# Patient Record
Sex: Female | Born: 1985 | Race: White | Hispanic: No | Marital: Single | Smoking: Current every day smoker
Health system: Southern US, Community
[De-identification: ages and names within clinical notes are randomized; demographics above are authoritative.]

## PROBLEM LIST (undated history)

## (undated) DIAGNOSIS — B009 Herpesviral infection, unspecified: Secondary | ICD-10-CM

## (undated) DIAGNOSIS — E079 Disorder of thyroid, unspecified: Secondary | ICD-10-CM

## (undated) DIAGNOSIS — F1910 Other psychoactive substance abuse, uncomplicated: Secondary | ICD-10-CM

## (undated) HISTORY — PX: LASIK: SHX215

## (undated) HISTORY — PX: REFRACTIVE SURGERY: SHX103

---

## 2013-09-10 ENCOUNTER — Encounter (HOSPITAL_BASED_OUTPATIENT_CLINIC_OR_DEPARTMENT_OTHER): Payer: Self-pay | Admitting: Emergency Medicine

## 2013-09-10 ENCOUNTER — Emergency Department (HOSPITAL_BASED_OUTPATIENT_CLINIC_OR_DEPARTMENT_OTHER)
Admission: EM | Admit: 2013-09-10 | Discharge: 2013-09-10 | Disposition: A | Discharge status: Home / Self Care | Payer: Medicaid Other | Attending: Emergency Medicine | Admitting: Emergency Medicine

## 2013-09-10 ENCOUNTER — Emergency Department (HOSPITAL_BASED_OUTPATIENT_CLINIC_OR_DEPARTMENT_OTHER): Payer: Medicaid Other

## 2013-09-10 DIAGNOSIS — R109 Unspecified abdominal pain: Secondary | ICD-10-CM

## 2013-09-10 DIAGNOSIS — Z862 Personal history of diseases of the blood and blood-forming organs and certain disorders involving the immune mechanism: Secondary | ICD-10-CM | POA: Insufficient documentation

## 2013-09-10 DIAGNOSIS — Z8639 Personal history of other endocrine, nutritional and metabolic disease: Secondary | ICD-10-CM | POA: Insufficient documentation

## 2013-09-10 DIAGNOSIS — Z3202 Encounter for pregnancy test, result negative: Secondary | ICD-10-CM | POA: Insufficient documentation

## 2013-09-10 DIAGNOSIS — R1011 Right upper quadrant pain: Secondary | ICD-10-CM | POA: Insufficient documentation

## 2013-09-10 HISTORY — DX: Disorder of thyroid, unspecified: E07.9

## 2013-09-10 LAB — COMPREHENSIVE METABOLIC PANEL
ALT: 23 U/L (ref 0–35)
AST: 22 U/L (ref 0–37)
Alkaline Phosphatase: 56 U/L (ref 39–117)
CO2: 31 mEq/L (ref 19–32)
Calcium: 10 mg/dL (ref 8.4–10.5)
Chloride: 99 mEq/L (ref 96–112)
GFR calc Af Amer: >90 mL/min (ref >90)
GFR calc non Af Amer: >90 mL/min (ref >90)
Glucose, Bld: 112 mg/dL — ABNORMAL HIGH (ref 70–99)
Potassium: 4.1 mEq/L (ref 3.5–5.1)
Sodium: 139 mEq/L (ref 135–145)
Total Bilirubin: 0.2 mg/dL — ABNORMAL LOW (ref 0.3–1.2)

## 2013-09-10 LAB — URINALYSIS, ROUTINE W REFLEX MICROSCOPIC
Bilirubin Urine: NEGATIVE
Glucose, UA: NEGATIVE mg/dL
Hgb urine dipstick: NEGATIVE
Protein, ur: NEGATIVE mg/dL
Urobilinogen, UA: 0.2 mg/dL (ref 0.0–1.0)
pH: 6.5 (ref 5.0–8.0)

## 2013-09-10 LAB — CBC WITH DIFFERENTIAL/PLATELET
Band Neutrophils: 4 % (ref 0–10)
Basophils Absolute: 0 10*3/uL (ref 0.0–0.1)
Blasts: 0 %
HCT: 35.7 % — ABNORMAL LOW (ref 36.0–46.0)
MCHC: 33.1 g/dL (ref 30.0–36.0)
MCV: 93.2 fL (ref 78.0–100.0)
Metamyelocytes Relative: 0 %
Monocytes Absolute: 0.4 10*3/uL (ref 0.1–1.0)
Monocytes Relative: 3 % (ref 3–12)
Myelocytes: 0 %
Neutro Abs: 9.5 10*3/uL — ABNORMAL HIGH (ref 1.7–7.7)
Platelets: 273 10*3/uL (ref 150–400)
Promyelocytes Absolute: 0 %
RDW: 12.8 % (ref 11.5–15.5)
WBC: 12.5 10*3/uL — ABNORMAL HIGH (ref 4.0–10.5)
nRBC: 0 /100 WBC

## 2013-09-10 LAB — PREGNANCY, URINE: Preg Test, Ur: NEGATIVE

## 2013-09-10 NOTE — ED Provider Notes (Signed)
Medical screening examination/treatment/procedure(s) were performed by non-physician practitioner and as supervising physician I was immediately available for consultation/collaboration.  EKG Interpretation   None         Gwyneth Sprout, MD 09/10/13 1528

## 2013-09-10 NOTE — ED Provider Notes (Signed)
CSN: [id number]     Arrival date & time 09/10/13  1153 History   First MD Initiated Contact with Patient 09/10/13 1203     Chief Complaint  Patient presents with  . Abdominal Pain   (Consider location/radiation/quality/duration/timing/severity/associated sxs/prior Treatment) HPI Comments: Pt states that she has been having right upper abdominal pain for 1 week:pt states that she has been in detox for a week so she isn't sure if it is related to that:pt states that her partner whom she shared needles with is hep c:pt states that she was tested 3 weeks ago and everything was negative, but she read that it may not show up for six months:denies fever, nausea, dysuria, vomiting and diarrhea:pt states that she has had a bad cough  The history is provided by the patient. No language interpreter was used.    Past Medical History  Diagnosis Date  . Thyroid disease    History reviewed. No pertinent past surgical history. No family history on file. History  Substance Use Topics  . Smoking status: Never Smoker   . Smokeless tobacco: Not on file  . Alcohol Use: No   OB History   Grav Para Term Preterm Abortions TAB SAB Ect Mult Living                 Review of Systems  Constitutional: Negative.   Respiratory: Negative.   Cardiovascular: Negative.     Allergies  Review of patient's allergies indicates no known allergies.  Home Medications  No current outpatient prescriptions on file. BP 105/68  Pulse 112  Temp(Src) 98 F (36.7 C) (Oral)  Resp 16  SpO2 98% Physical Exam  Nursing note and vitals reviewed. Constitutional: She is oriented to person, place, and time. She appears well-developed and well-nourished.  Cardiovascular: Normal rate and regular rhythm.   Pulmonary/Chest: Effort normal and breath sounds normal.  Abdominal: Soft. Bowel sounds are normal. There is no tenderness.  Musculoskeletal: Normal range of motion.  Neurological: She is alert and oriented to person,  place, and time.  Skin: Skin is warm and dry.  Psychiatric: She has a normal mood and affect.    ED Course  Procedures (including critical care time) Labs Review Labs Reviewed  CBC WITH DIFFERENTIAL - Abnormal; Notable for the following:    WBC 12.5 (*)    RBC 3.83 (*)    Hemoglobin 11.8 (*)    HCT 35.7 (*)    Neutro Abs 9.5 (*)    All other components within normal limits  COMPREHENSIVE METABOLIC PANEL - Abnormal; Notable for the following:    Glucose, Bld 112 (*)    Total Bilirubin 0.2 (*)    All other components within normal limits  URINALYSIS, ROUTINE W REFLEX MICROSCOPIC - Abnormal; Notable for the following:    APPearance CLOUDY (*)    All other components within normal limits  LIPASE, BLOOD  PREGNANCY, URINE   Imaging Review Dg Chest 2 View  09/10/2013   CLINICAL DATA:  Cough, congestion  EXAM: CHEST  2 VIEW  COMPARISON:  None.  FINDINGS: The heart size and mediastinal contours are within normal limits. There is no focal infiltrate, pulmonary edema, or pleural effusion. The visualized skeletal structures are unremarkable.  IMPRESSION: No active cardiopulmonary disease.   Electronically Signed   By: Sherian Rein M.D.   On: 09/10/2013 13:25    EKG Interpretation   None       MDM   1. Abdominal pain    Abdomen is  benign:no fevers:pt tender on ribs:no narcotics as pt is seeking treated for drugs    Teressa Lower, NP 09/10/13 1458

## 2013-09-10 NOTE — ED Notes (Signed)
Pt reports upper right sided pain x 1 week. 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 States boyfriend positive for hepatitis C, has shared needles in past. Tested x 1 month ago and was negative. Pt reports productive cough, no nausea/vomiting.

## 2013-09-10 NOTE — ED Notes (Signed)
Patient unable to provide urine specimen at this time. Aware of need and given call bell to notify staff when ready

## 2017-05-13 ENCOUNTER — Encounter (HOSPITAL_COMMUNITY): Payer: Self-pay

## 2017-05-13 ENCOUNTER — Emergency Department (HOSPITAL_COMMUNITY)
Admission: EM | Admit: 2017-05-13 | Discharge: 2017-05-13 | Disposition: A | Discharge status: Home / Self Care | Payer: Medicaid Other | Attending: Emergency Medicine | Admitting: Emergency Medicine

## 2017-05-13 ENCOUNTER — Emergency Department (HOSPITAL_COMMUNITY): Payer: Medicaid Other

## 2017-05-13 DIAGNOSIS — Y999 Unspecified external cause status: Secondary | ICD-10-CM | POA: Diagnosis not present

## 2017-05-13 DIAGNOSIS — S0990XA Unspecified injury of head, initial encounter: Secondary | ICD-10-CM | POA: Diagnosis present

## 2017-05-13 DIAGNOSIS — S060X1A Concussion with loss of consciousness of 30 minutes or less, initial encounter: Secondary | ICD-10-CM | POA: Insufficient documentation

## 2017-05-13 DIAGNOSIS — Y929 Unspecified place or not applicable: Secondary | ICD-10-CM | POA: Insufficient documentation

## 2017-05-13 DIAGNOSIS — S161XXA Strain of muscle, fascia and tendon at neck level, initial encounter: Secondary | ICD-10-CM | POA: Diagnosis not present

## 2017-05-13 DIAGNOSIS — Y9389 Activity, other specified: Secondary | ICD-10-CM | POA: Diagnosis not present

## 2017-05-13 DIAGNOSIS — M25512 Pain in left shoulder: Secondary | ICD-10-CM | POA: Diagnosis not present

## 2017-05-13 DIAGNOSIS — M25562 Pain in left knee: Secondary | ICD-10-CM | POA: Diagnosis not present

## 2017-05-13 DIAGNOSIS — S060X9A Concussion with loss of consciousness of unspecified duration, initial encounter: Secondary | ICD-10-CM

## 2017-05-13 LAB — I-STAT BETA HCG BLOOD, ED (MC, WL, AP ONLY)

## 2017-05-13 MED ORDER — ONDANSETRON 4 MG PO TBDP
4.0000 mg | ORAL_TABLET | Freq: Once | ORAL | Status: AC
  Administered 2017-05-13: 4 mg via ORAL
  Filled 2017-05-13: qty 1

## 2017-05-13 MED ORDER — IBUPROFEN 800 MG PO TABS: 800.0000 mg | ORAL_TABLET | Freq: Three times a day (TID) | ORAL | 0 refills | Status: DC | PRN

## 2017-05-13 MED ORDER — KETOROLAC TROMETHAMINE 30 MG/ML IJ SOLN
30.0000 mg | Freq: Once | INTRAMUSCULAR | Status: AC
  Administered 2017-05-13: 30 mg via INTRAMUSCULAR
  Filled 2017-05-13: qty 1

## 2017-05-13 MED ORDER — METHOCARBAMOL 500 MG PO TABS: 500.0000 mg | ORAL_TABLET | Freq: Two times a day (BID) | ORAL | 0 refills | Status: AC

## 2017-05-13 MED ORDER — METHOCARBAMOL 500 MG PO TABS
500.0000 mg | ORAL_TABLET | Freq: Once | ORAL | Status: DC
  Filled 2017-05-13: qty 1

## 2017-05-13 NOTE — ED Notes (Signed)
Patient transported to CT 
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
## 2017-05-13 NOTE — Discharge Instructions (Signed)

## 2017-05-13 NOTE — ED Notes (Signed)
Pt given water for PO challenge 
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
## 2018-12-16 ENCOUNTER — Emergency Department (HOSPITAL_BASED_OUTPATIENT_CLINIC_OR_DEPARTMENT_OTHER): Payer: Self-pay

## 2018-12-16 ENCOUNTER — Encounter (HOSPITAL_BASED_OUTPATIENT_CLINIC_OR_DEPARTMENT_OTHER): Payer: Self-pay | Admitting: Emergency Medicine

## 2018-12-16 ENCOUNTER — Inpatient Hospital Stay (HOSPITAL_BASED_OUTPATIENT_CLINIC_OR_DEPARTMENT_OTHER)
Admission: EM | Admit: 2018-12-16 | Discharge: 2018-12-21 | DRG: 872 | Disposition: A | Discharge status: Home / Self Care | Payer: Self-pay | Attending: Internal Medicine | Admitting: Internal Medicine

## 2018-12-16 ENCOUNTER — Other Ambulatory Visit: Payer: Self-pay

## 2018-12-16 DIAGNOSIS — Z79899 Other long term (current) drug therapy: Secondary | ICD-10-CM

## 2018-12-16 DIAGNOSIS — E876 Hypokalemia: Secondary | ICD-10-CM | POA: Diagnosis present

## 2018-12-16 DIAGNOSIS — N3090 Cystitis, unspecified without hematuria: Secondary | ICD-10-CM | POA: Diagnosis present

## 2018-12-16 DIAGNOSIS — F1210 Cannabis abuse, uncomplicated: Secondary | ICD-10-CM | POA: Diagnosis present

## 2018-12-16 DIAGNOSIS — Z791 Long term (current) use of non-steroidal anti-inflammatories (NSAID): Secondary | ICD-10-CM

## 2018-12-16 DIAGNOSIS — Z716 Tobacco abuse counseling: Secondary | ICD-10-CM

## 2018-12-16 DIAGNOSIS — J219 Acute bronchiolitis, unspecified: Secondary | ICD-10-CM | POA: Diagnosis present

## 2018-12-16 DIAGNOSIS — N12 Tubulo-interstitial nephritis, not specified as acute or chronic: Secondary | ICD-10-CM

## 2018-12-16 DIAGNOSIS — N200 Calculus of kidney: Secondary | ICD-10-CM | POA: Diagnosis present

## 2018-12-16 DIAGNOSIS — D733 Abscess of spleen: Secondary | ICD-10-CM | POA: Diagnosis present

## 2018-12-16 DIAGNOSIS — E041 Nontoxic single thyroid nodule: Secondary | ICD-10-CM | POA: Diagnosis present

## 2018-12-16 DIAGNOSIS — A4151 Sepsis due to Escherichia coli [E. coli]: Principal | ICD-10-CM | POA: Diagnosis present

## 2018-12-16 DIAGNOSIS — F1510 Other stimulant abuse, uncomplicated: Secondary | ICD-10-CM | POA: Diagnosis present

## 2018-12-16 DIAGNOSIS — F1990 Other psychoactive substance use, unspecified, uncomplicated: Secondary | ICD-10-CM | POA: Diagnosis present

## 2018-12-16 DIAGNOSIS — Z1611 Resistance to penicillins: Secondary | ICD-10-CM | POA: Diagnosis present

## 2018-12-16 DIAGNOSIS — N10 Acute tubulo-interstitial nephritis: Secondary | ICD-10-CM | POA: Diagnosis present

## 2018-12-16 DIAGNOSIS — F17210 Nicotine dependence, cigarettes, uncomplicated: Secondary | ICD-10-CM | POA: Diagnosis present

## 2018-12-16 HISTORY — DX: Other psychoactive substance abuse, uncomplicated: F19.10

## 2018-12-16 LAB — CBC
HCT: 38.4 % (ref 36.0–46.0)
HEMATOCRIT: 33.5 % — AB (ref 36.0–46.0)
Hemoglobin: 12.8 g/dL (ref 12.0–15.0)
Hemoglobin: 10.9 g/dL — ABNORMAL LOW (ref 12.0–15.0)
MCH: 30.1 pg (ref 26.0–34.0)
MCH: 30.7 pg (ref 26.0–34.0)
MCHC: 33.3 g/dL (ref 30.0–36.0)
MCHC: 32.5 g/dL (ref 30.0–36.0)
MCV: 90.4 fL (ref 80.0–100.0)
MCV: 94.4 fL (ref 80.0–100.0)
Platelets: 185 10*3/uL (ref 150–400)
Platelets: 145 10*3/uL — ABNORMAL LOW (ref 150–400)
RBC: 4.25 MIL/uL (ref 3.87–5.11)
RBC: 3.55 MIL/uL — ABNORMAL LOW (ref 3.87–5.11)
RDW: 12.6 % (ref 11.5–15.5)
RDW: 12.7 % (ref 11.5–15.5)
WBC: 24.5 10*3/uL — AB (ref 4.0–10.5)
WBC: 18.2 10*3/uL — ABNORMAL HIGH (ref 4.0–10.5)
nRBC: 0 % (ref 0.0–0.2)
nRBC: 0 % (ref 0.0–0.2)

## 2018-12-16 LAB — COMPREHENSIVE METABOLIC PANEL
ALT: 13 U/L (ref 0–44)
ANION GAP: 10 (ref 5–15)
AST: 21 U/L (ref 15–41)
Albumin: 3.8 g/dL (ref 3.5–5.0)
Alkaline Phosphatase: 45 U/L (ref 38–126)
BUN: 15 mg/dL (ref 6–20)
CALCIUM: 8.9 mg/dL (ref 8.9–10.3)
CO2: 24 mmol/L (ref 22–32)
Chloride: 96 mmol/L — ABNORMAL LOW (ref 98–111)
Creatinine, Ser: 0.88 mg/dL (ref 0.44–1.00)
GFR calc Af Amer: >60 mL/min (ref >60)
GFR calc non Af Amer: >60 mL/min (ref >60)
Glucose, Bld: 182 mg/dL — ABNORMAL HIGH (ref 70–99)
Potassium: 3.5 mmol/L (ref 3.5–5.1)
Sodium: 130 mmol/L — ABNORMAL LOW (ref 135–145)
Total Bilirubin: 1.2 mg/dL (ref 0.3–1.2)
Total Protein: 7.6 g/dL (ref 6.5–8.1)

## 2018-12-16 LAB — RAPID URINE DRUG SCREEN, HOSP PERFORMED
Amphetamines: POSITIVE — AB
Barbiturates: NOT DETECTED
Benzodiazepines: NOT DETECTED
Cocaine: NOT DETECTED
Opiates: NOT DETECTED
TETRAHYDROCANNABINOL: POSITIVE — AB

## 2018-12-16 LAB — URINALYSIS, ROUTINE W REFLEX MICROSCOPIC
Glucose, UA: NEGATIVE mg/dL
Ketones, ur: NEGATIVE mg/dL
NITRITE: NEGATIVE
Protein, ur: 100 mg/dL — AB
Specific Gravity, Urine: 1.025 (ref 1.005–1.030)
pH: 6 (ref 5.0–8.0)

## 2018-12-16 LAB — PREGNANCY, URINE: Preg Test, Ur: NEGATIVE

## 2018-12-16 LAB — URINALYSIS, MICROSCOPIC (REFLEX): WBC, UA: >50 WBC/hpf (ref 0–5)

## 2018-12-16 LAB — LACTIC ACID, PLASMA
LACTIC ACID, VENOUS: 0.9 mmol/L (ref 0.5–1.9)
Lactic Acid, Venous: 2.8 mmol/L (ref 0.5–1.9)

## 2018-12-16 LAB — CREATININE, SERUM
Creatinine, Ser: 0.51 mg/dL (ref 0.44–1.00)
GFR calc Af Amer: >60 mL/min (ref >60)
GFR calc non Af Amer: >60 mL/min (ref >60)

## 2018-12-16 LAB — MRSA PCR SCREENING: MRSA by PCR: NEGATIVE

## 2018-12-16 MED ORDER — KETOROLAC TROMETHAMINE 30 MG/ML IJ SOLN
30.0000 mg | Freq: Once | INTRAMUSCULAR | Status: AC
  Administered 2018-12-16: 30 mg via INTRAVENOUS
  Filled 2018-12-16: qty 1

## 2018-12-16 MED ORDER — METHOCARBAMOL 500 MG PO TABS
500.0000 mg | ORAL_TABLET | Freq: Two times a day (BID) | ORAL | Status: DC
  Administered 2018-12-16 – 2018-12-21 (×10): 500 mg via ORAL
  Filled 2018-12-16 (×10): qty 1

## 2018-12-16 MED ORDER — ONDANSETRON HCL 4 MG/2ML IJ SOLN
4.0000 mg | Freq: Four times a day (QID) | INTRAMUSCULAR | Status: DC | PRN
  Administered 2018-12-17 – 2018-12-21 (×5): 4 mg via INTRAVENOUS
  Filled 2018-12-16 (×5): qty 2

## 2018-12-16 MED ORDER — SODIUM CHLORIDE 0.9 % IV SOLN
INTRAVENOUS | Status: DC
  Administered 2018-12-16 – 2018-12-21 (×9): via INTRAVENOUS

## 2018-12-16 MED ORDER — SODIUM CHLORIDE 0.9 % IV SOLN
1.0000 g | Freq: Once | INTRAVENOUS | Status: AC
  Administered 2018-12-16: 1 g via INTRAVENOUS
  Filled 2018-12-16: qty 10

## 2018-12-16 MED ORDER — ACETAMINOPHEN 650 MG RE SUPP: 650.0000 mg | Freq: Four times a day (QID) | RECTAL | Status: DC | PRN

## 2018-12-16 MED ORDER — SODIUM CHLORIDE 0.9 % IV BOLUS (SEPSIS)
1000.0000 mL | Freq: Once | INTRAVENOUS | Status: AC
  Administered 2018-12-16: 1000 mL via INTRAVENOUS

## 2018-12-16 MED ORDER — VANCOMYCIN HCL IN DEXTROSE 1-5 GM/200ML-% IV SOLN
1000.0000 mg | Freq: Once | INTRAVENOUS | Status: AC
  Administered 2018-12-16: 1000 mg via INTRAVENOUS
  Filled 2018-12-16: qty 200

## 2018-12-16 MED ORDER — IBUPROFEN 400 MG PO TABS
600.0000 mg | ORAL_TABLET | Freq: Once | ORAL | Status: AC
  Administered 2018-12-16: 600 mg via ORAL
  Filled 2018-12-16: qty 1

## 2018-12-16 MED ORDER — KETOROLAC TROMETHAMINE 15 MG/ML IJ SOLN
15.0000 mg | Freq: Four times a day (QID) | INTRAMUSCULAR | Status: AC | PRN
  Administered 2018-12-16 – 2018-12-18 (×4): 15 mg via INTRAVENOUS
  Filled 2018-12-16 (×4): qty 1

## 2018-12-16 MED ORDER — SODIUM CHLORIDE 0.9% FLUSH
3.0000 mL | Freq: Once | INTRAVENOUS | Status: DC
  Filled 2018-12-16: qty 3

## 2018-12-16 MED ORDER — ACETAMINOPHEN 500 MG PO TABS
1000.0000 mg | ORAL_TABLET | Freq: Once | ORAL | Status: AC
  Administered 2018-12-16: 1000 mg via ORAL
  Filled 2018-12-16: qty 2

## 2018-12-16 MED ORDER — POLYETHYLENE GLYCOL 3350 17 G PO PACK: 17.0000 g | PACK | Freq: Every day | ORAL | Status: DC | PRN

## 2018-12-16 MED ORDER — ENOXAPARIN SODIUM 40 MG/0.4ML ~~LOC~~ SOLN
40.0000 mg | SUBCUTANEOUS | Status: DC
  Administered 2018-12-16 – 2018-12-21 (×6): 40 mg via SUBCUTANEOUS
  Filled 2018-12-16 (×6): qty 0.4

## 2018-12-16 MED ORDER — ONDANSETRON HCL 4 MG/2ML IJ SOLN
4.0000 mg | Freq: Once | INTRAMUSCULAR | Status: AC
  Administered 2018-12-16: 4 mg via INTRAVENOUS
  Filled 2018-12-16: qty 2

## 2018-12-16 MED ORDER — SORBITOL 70 % SOLN: 30.0000 mL | Freq: Every day | Status: DC | PRN

## 2018-12-16 MED ORDER — MORPHINE SULFATE (PF) 4 MG/ML IV SOLN
4.0000 mg | Freq: Once | INTRAVENOUS | Status: AC
  Administered 2018-12-16: 4 mg via INTRAVENOUS
  Filled 2018-12-16: qty 1

## 2018-12-16 MED ORDER — ACETAMINOPHEN 325 MG PO TABS
650.0000 mg | ORAL_TABLET | Freq: Four times a day (QID) | ORAL | Status: DC | PRN
  Administered 2018-12-16 – 2018-12-17 (×4): 650 mg via ORAL
  Filled 2018-12-16 (×4): qty 2

## 2018-12-16 MED ORDER — TRAMADOL HCL 50 MG PO TABS
50.0000 mg | ORAL_TABLET | Freq: Four times a day (QID) | ORAL | Status: DC | PRN
  Administered 2018-12-16 – 2018-12-18 (×2): 50 mg via ORAL
  Filled 2018-12-16 (×2): qty 1

## 2018-12-16 MED ORDER — SODIUM CHLORIDE 0.9 % IV SOLN
INTRAVENOUS | Status: DC
  Administered 2018-12-16 (×2): via INTRAVENOUS

## 2018-12-16 MED ORDER — IOHEXOL 300 MG/ML  SOLN
100.0000 mL | Freq: Once | INTRAMUSCULAR | Status: AC | PRN
  Administered 2018-12-16: 100 mL via INTRAVENOUS

## 2018-12-16 MED ORDER — ONDANSETRON HCL 4 MG PO TABS: 4.0000 mg | ORAL_TABLET | Freq: Four times a day (QID) | ORAL | Status: DC | PRN

## 2018-12-16 NOTE — ED Notes (Addendum)
RN Augusto Gamble Tried to call to give report to floor RN, NR.

## 2018-12-16 NOTE — ED Triage Notes (Signed)
Taken cranberry pills and 600mg  ibuprofen yesterday with some relief

## 2018-12-16 NOTE — ED Notes (Signed)
Report given to Cindy, RN.

## 2018-12-16 NOTE — ED Notes (Signed)
Floor RN Konrad Felix called for report. This RN tried calling report when [facility] arrived to pick up patient but received NR.

## 2018-12-16 NOTE — H&P (Signed)
Hannah Bailey is an 33 y.o. female.   Chief Complaint: Dysuria, fever, chills, nausea, vomiting, abdominal pain HPI: The patient is a 33 yr old woman who states that the above symptoms began yesterday. She states that she had some dysuria earlier in the week, but tried to treat it with cranberry juice.   Upon arrival at [facility], Temperature was 98.4. Respiratory rate is 17. Respiratory rate is 92. Blood pressure is 105/65. Her oxygen saturation was 99% on room air.  CREatinine was 0.51. Lactic acid was 0.9. WBC was 18.2 Hemoglobin was 10.9. Hematocrit was 33.5. Platelets were 145. Blood cultures x 2 were negative. Fungal cultures are pending. Urine culture is pending.   CT abdomen and pelvis demonstrted cystitis and left pyelonephritis. No drainable abscess was identified. There was non-obstructing left nephrolithiasis. There were also innumerable subcentimeter low density lesions throughout the spleen. These could be micro-abscesses, including fungal. There are tree in bud opacityies in the right lower lobe suspicious for bronchiolitis.  The hospitalists have been consulted to admit the patient for further evaluation and treatment.  Past Medical History:  Diagnosis Date  . Drug abuse, IV (HCC)   . Thyroid disease     Past Surgical History:  Procedure Laterality Date  . REFRACTIVE SURGERY Bilateral     History reviewed. No pertinent family history. Social History:  reports that she has been smoking cigarettes. She has been smoking about 0.50 packs per day. She has never used smokeless tobacco. She reports current drug use. Drugs: Methamphetamines, Cocaine, and IV. She reports that she does not drink alcohol. Medications Prior to Admission  Medication Sig Dispense Refill  . ibuprofen (ADVIL,MOTRIN) 800 MG tablet Take 1 tablet (800 mg total) by mouth every 8 (eight) hours as needed. 21 tablet 0  . methocarbamol (ROBAXIN) 500 MG tablet Take 1 tablet (500 mg total) by mouth 2 (two) times  daily. 20 tablet 0    Allergies: No Known Allergies  Pertinent items are noted in HPI.  General appearance: alert, cooperative and no distress Head: Normocephalic, without obvious abnormality, atraumatic Eyes: conjunctivae/corneas clear. PERRL, EOM's intact. Fundi benign. Throat: lips, mucosa, and tongue normal; teeth and gums normal Neck: no adenopathy, no carotid bruit, no JVD, supple, symmetrical, trachea midline and thyroid not enlarged, symmetric, no tenderness/mass/nodules Resp: No increased work of breathing. No wheezes, rales, or rhonchi. No tactile fremitus. Chest wall: no tenderness Cardio: regular rate and rhythm, S1, S2 normal, no murmur, click, rub or gallop GI: soft, non-tender; bowel sounds normal; no masses,  no organomegaly, positive for left CVA pain Extremities: extremities normal, atraumatic, no cyanosis or edema Pulses: 2+ and symmetric Skin: Skin color, texture, turgor normal. No rashes or lesions Lymph nodes: Cervical, supraclavicular, and axillary nodes normal. Neurologic: Grossly normal  Results for orders placed or performed during the hospital encounter of 12/16/18 (from the past 48 hour(s))  Comprehensive metabolic panel     Status: Abnormal   Collection Time: 12/16/18  2:26 AM  Result Value Ref Range   Sodium 130 (L) 135 - 145 mmol/L   Potassium 3.5 3.5 - 5.1 mmol/L   Chloride 96 (L) 98 - 111 mmol/L   CO2 24 22 - 32 mmol/L   Glucose, Bld 182 (H) 70 - 99 mg/dL   BUN 15 6 - 20 mg/dL   Creatinine, Ser 0.16 0.44 - 1.00 mg/dL   Calcium 8.9 8.9 - 01.0 mg/dL   Total Protein 7.6 6.5 - 8.1 g/dL   Albumin 3.8 3.5 - 5.0  g/dL   AST 21 15 - 41 U/L   ALT 13 0 - 44 U/L   Alkaline Phosphatase 45 38 - 126 U/L   Total Bilirubin 1.2 0.3 - 1.2 mg/dL   GFR calc non Af Amer >60 >60 mL/min   GFR calc Af Amer >60 >60 mL/min   Anion gap 10 5 - 15    Comment: Performed at [facility], [address] [place], [place] [address]  CBC     Status: Abnormal    Collection Time: 12/16/18  2:26 AM  Result Value Ref Range   WBC 24.5 (H) 4.0 - 10.5 K/uL   RBC 4.25 3.87 - 5.11 MIL/uL   Hemoglobin 12.8 12.0 - 15.0 g/dL   HCT 60.4 54.0 - 98.1 %   MCV 90.4 80.0 - 100.0 fL   MCH 30.1 26.0 - 34.0 pg   MCHC 33.3 30.0 - 36.0 g/dL   RDW 19.1 47.8 - 29.5 %   Platelets 185 150 - 400 K/uL   nRBC 0.0 0.0 - 0.2 %    Comment: Performed at [facility], [address] [place], [place] [address]  Urinalysis, Routine w reflex microscopic     Status: Abnormal   Collection Time: 12/16/18  2:26 AM  Result Value Ref Range   Color, Urine YELLOW YELLOW   APPearance CLOUDY (A) CLEAR   Specific Gravity, Urine 1.025 1.005 - 1.030   pH 6.0 5.0 - 8.0   Glucose, UA NEGATIVE NEGATIVE mg/dL   Hgb urine dipstick LARGE (A) NEGATIVE   Bilirubin Urine SMALL (A) NEGATIVE   Ketones, ur NEGATIVE NEGATIVE mg/dL   Protein, ur 865 (A) NEGATIVE mg/dL   Nitrite NEGATIVE NEGATIVE   Leukocytes,Ua MODERATE (A) NEGATIVE    Comment: Performed at [facility], [address] [place], [place] [address]  Pregnancy, urine     Status: None   Collection Time: 12/16/18  2:26 AM  Result Value Ref Range   Preg Test, Ur NEGATIVE NEGATIVE    Comment:        THE SENSITIVITY OF THIS METHODOLOGY IS >20 mIU/mL. Performed at [facility], [address] [place], [place] [address]   Lactic acid, plasma     Status: Abnormal   Collection Time: 12/16/18  2:26 AM  Result Value Ref Range   Lactic Acid, Venous 2.8 (HH) 0.5 - 1.9 mmol/L    Comment: CRITICAL RESULT CALLED TO, READ BACK BY AND VERIFIED WITH: VOGT,S,RN @ 0257 12/16/18 BY GWYN,P Performed at [facility], [address] [place], [place] [address]   Urinalysis, Microscopic (reflex)     Status: Abnormal   Collection Time: 12/16/18  2:26 AM  Result Value Ref Range   RBC / HPF 21-50 0 - 5 RBC/hpf   WBC, UA >50 0 - 5 WBC/hpf   Bacteria, UA MANY (A) NONE SEEN   Squamous Epithelial / LPF 11-20 0 -  5   Mucus PRESENT     Comment: Performed at [facility], [address] [place], [place] [address]  Rapid urine drug screen (hospital performed)     Status: Abnormal   Collection Time: 12/16/18  2:26 AM  Result Value Ref Range   Opiates NONE DETECTED NONE DETECTED   Cocaine NONE DETECTED NONE DETECTED   Benzodiazepines NONE DETECTED NONE DETECTED   Amphetamines POSITIVE (A) NONE DETECTED   Tetrahydrocannabinol POSITIVE (A) NONE DETECTED   Barbiturates  NONE DETECTED NONE DETECTED    Comment: (NOTE) DRUG SCREEN FOR MEDICAL PURPOSES ONLY.  IF CONFIRMATION IS NEEDED FOR ANY PURPOSE, NOTIFY LAB WITHIN 5 DAYS. LOWEST DETECTABLE LIMITS FOR URINE DRUG SCREEN Drug Class                     Cutoff (ng/mL) Amphetamine and metabolites    1000 Barbiturate and metabolites    200 Benzodiazepine                 200 Tricyclics and metabolites     300 Opiates and metabolites        300 Cocaine and metabolites        300 THC                            50 Performed at [facility], [address] [place], [place] [address]   Blood culture (routine x 2)     Status: None (Preliminary result)   Collection Time: 12/16/18  3:10 AM  Result Value Ref Range   Specimen Description      BLOOD LEFT ARM Performed at [facility], [address] [place], [place] [address]    Special Requests      BOTTLES DRAWN AEROBIC AND ANAEROBIC Blood Culture adequate volume Performed at [facility], [address] [place], [place] [address]    Culture      NO GROWTH < 12 HOURS Performed at [facility], [address], [place], [place] [address]    Report Status PENDING   Blood culture (routine x 2)     Status: None (Preliminary result)   Collection Time: 12/16/18  3:24 AM  Result Value Ref Range   Specimen Description      BLOOD RIGHT ARM Performed at [facility], [address] [place], [place] [address]    Special Requests      BOTTLES  DRAWN AEROBIC AND ANAEROBIC Blood Culture adequate volume Performed at [facility], [address] [place], [place] [address]    Culture      NO GROWTH < 12 HOURS Performed at [facility], [address], [place], [place] [address]    Report Status PENDING   Lactic acid, plasma     Status: None   Collection Time: 12/16/18  6:05 AM  Result Value Ref Range   Lactic Acid, Venous 0.9 0.5 - 1.9 mmol/L    Comment: Performed at [facility], [address] [place], [place] [address]  CBC     Status: Abnormal   Collection Time: 12/16/18  3:41 PM  Result Value Ref Range   WBC 18.2 (H) 4.0 - 10.5 K/uL   RBC 3.55 (L) 3.87 - 5.11 MIL/uL   Hemoglobin 10.9 (L) 12.0 - 15.0 g/dL   HCT 51.7 (L) 00.1 - 74.9 %   MCV 94.4 80.0 - 100.0 fL   MCH 30.7 26.0 - 34.0 pg   MCHC 32.5 30.0 - 36.0 g/dL   RDW 44.9 67.5 - 91.6 %   Platelets 145 (L) 150 - 400 K/uL   nRBC 0.0 0.0 - 0.2 %    Comment: Performed at [facility], [address] [place], [place] [address]  Creatinine, serum     Status: None   Collection Time: 12/16/18  3:41 PM  Result Value  Ref Range   Creatinine, Ser 0.51 0.44 - 1.00 mg/dL   GFR calc non Af Amer >60 >60 mL/min   GFR calc Af Amer >60 >60 mL/min    Comment: Performed at [facility], [address] [place], [place] [address]   @  Blood pressure 104/64, pulse 96, temperature 98.4 F (36.9 C), temperature source Oral, resp. rate 16, height  (1.651 m), weight 59 kg, last menstrual period 12/10/2018, SpO2 100 %.    Assessment/Plan Problem  Bronchiolitis  Splenic Abscess  Ivdu (Intravenous Drug User)   The patient is admitted to a telemetry bed. She is receiving IV rocephin. Blood cultures and urine cultures are pending. Fungal blood cultures are drawn. She is receiving IV fluids, antiemetics, and pain control.  I have seen and examined this patient myself. I have spent 72 minutes in her her  evaluation and treatment.  Aliece Honold 12/16/2018, 5:05 PM

## 2018-12-16 NOTE — Care Management (Signed)
  This is a no charge note  Transfer from [facility] per Dr. Elesa Massed  33 year old lady with a past medical history of IV drug abuse, tobacco abuse, who presents with dysuria, fever, chills, nausea, vomiting, abdominal pain.  Found to have positive urinalysis for UTI.  CT scan is consistent with pyelonephritis with nonobstructive kidney stone.  Patient is septic with leukocytosis, soft blood pressure, tachycardia and tachypnea.  Lactic acid is elevated at 2.8.  Patient is admitted to telemetry bed as inpatient.   CT also showed "Innumerable subcentimeter low-density lesions throughout the spleen. This may be infectious and represent micro abscesses, including fungal". Rocephin was started in ED. Vancomycin will be add. Pt is admitted to SDU as inpt    Please call manager of [facility] at [phone] when pt arrives to floor   Lorretta Harp, MD  [facility]   If 7PM-7AM, please contact night-coverage [email or web address] Password [facility] 12/16/2018, 4:23 AM

## 2018-12-16 NOTE — ED Notes (Signed)
Date and time results received: 12/16/18 0258 (use smartphrase ".now" to insert current time)  Test: lactic acid Critical Value: 2.8  Name of Provider Notified: Dr. Elesa Massed Orders Received? Or Actions Taken?:

## 2018-12-16 NOTE — ED Notes (Signed)
ED TO INPATIENT HANDOFF REPORT  ED Nurse Name and Phone #: Rozell SearingWendy,RN [phone]  S Name/Age/Gender Hannah Bailey 33 y.o. female Room/Bed: MH06/MH06  Code Status   Code Status: Not on file  Home/SNF/Other Home Patient oriented to: self, place, time and situation Is this baseline? Yes   Triage Complete: Triage complete  Chief Complaint abd pain  Triage Note Pt states that for the past two days she has been experiencing nausea and vomiting with frequent urination and dysuria. States she is experiencing lower left abdominal pain. Subjective fever and chills.  Taken cranberry pills and 600mg  ibuprofen yesterday with some relief    Allergies No Known Allergies  Level of Care/Admitting Diagnosis ED Disposition    ED Disposition Condition Comment   Admit  [facility] [[id number]]  Level of Care: Stepdown [14]  Admit to SDU based on following criteria: Hemodynamic compromise or significant risk of instability:  Patient requiring short term acute titration and management of vasoactive drips, and invasive monitoring (i.e., CVP and Arterial line).  Admit to SDU based on following criteria: Other see comments  Comments: pt is at high risk of deteriorating  Diagnosis: Pyelonephritis [829562][[id number]]  Admitting Physician: Lorretta HarpNIU, XILIN [[id number]]  Attending Physician: Lorretta HarpNIU, XILIN [phone][[id number]]  Estimated length of stay: past midnight tomorrow  Certification:: I certify this patient will need inpatient services for at least 2 midnights  PT Class (Do Not Modify): Inpatient [101]  PT Acc Code (Do Not Modify): Private [1]       B Medical/Surgery History Past Medical History:  Diagnosis Date  . Drug abuse, IV (HCC)   . Thyroid disease    History reviewed. No pertinent surgical history.   A IV Location/Drains/Wounds Patient Lines/Drains/Airways Status   Active Line/Drains/Airways    Name:   Placement date:   Placement time:   Site:   Days:   Peripheral IV  12/16/18 Right Antecubital   12/16/18    0235    Antecubital   less than 1   Peripheral IV 12/16/18 Left Antecubital   12/16/18    0246    Antecubital   less than 1          Intake/Output Last 24 hours No intake or output data in the 24 hours ending 12/16/18 [id number]  Labs/Imaging Results for orders placed or performed during the hospital encounter of 12/16/18 (from the past 48 hour(s))  Comprehensive metabolic panel     Status: Abnormal   Collection Time: 12/16/18  2:26 AM  Result Value Ref Range   Sodium 130 (L) 135 - 145 mmol/L   Potassium 3.5 3.5 - 5.1 mmol/L   Chloride 96 (L) 98 - 111 mmol/L   CO2 24 22 - 32 mmol/L   Glucose, Bld 182 (H) 70 - 99 mg/dL   BUN 15 6 - 20 mg/dL   Creatinine, Ser 4.690.88 0.44 - 1.00 mg/dL   Calcium 8.9 8.9 - 62.910.3 mg/dL   Total Protein 7.6 6.5 - 8.1 g/dL   Albumin 3.8 3.5 - 5.0 g/dL   AST 21 15 - 41 U/L   ALT 13 0 - 44 U/L   Alkaline Phosphatase 45 38 - 126 U/L   Total Bilirubin 1.2 0.3 - 1.2 mg/dL   GFR calc non Af Amer >60 >60 mL/min   GFR calc Af Amer >60 >60 mL/min   Anion gap 10 5 - 15    Comment: Performed at [facility], [address] [place], [place] [address]  CBC     Status: Abnormal   Collection Time: 12/16/18  2:26 AM  Result Value Ref Range   WBC 24.5 (H) 4.0 - 10.5 K/uL   RBC 4.25 3.87 - 5.11 MIL/uL   Hemoglobin 12.8 12.0 - 15.0 g/dL   HCT 16.1 09.6 - 04.5 %   MCV 90.4 80.0 - 100.0 fL   MCH 30.1 26.0 - 34.0 pg   MCHC 33.3 30.0 - 36.0 g/dL   RDW 40.9 81.1 - 91.4 %   Platelets 185 150 - 400 K/uL   nRBC 0.0 0.0 - 0.2 %    Comment: Performed at [facility], [address] [place], [place] [address]  Urinalysis, Routine w reflex microscopic     Status: Abnormal   Collection Time: 12/16/18  2:26 AM  Result Value Ref Range   Color, Urine YELLOW YELLOW   APPearance CLOUDY (A) CLEAR   Specific Gravity, Urine 1.025 1.005 - 1.030   pH 6.0 5.0 - 8.0   Glucose, UA NEGATIVE NEGATIVE mg/dL   Hgb urine dipstick  LARGE (A) NEGATIVE   Bilirubin Urine SMALL (A) NEGATIVE   Ketones, ur NEGATIVE NEGATIVE mg/dL   Protein, ur 621 (A) NEGATIVE mg/dL   Nitrite NEGATIVE NEGATIVE   Leukocytes,Ua MODERATE (A) NEGATIVE    Comment: Performed at [facility], [address] [place], [place] [address]  Pregnancy, urine     Status: None   Collection Time: 12/16/18  2:26 AM  Result Value Ref Range   Preg Test, Ur NEGATIVE NEGATIVE    Comment:        THE SENSITIVITY OF THIS METHODOLOGY IS >20 mIU/mL. Performed at [facility], [address] [place], [place] [address]   Lactic acid, plasma     Status: Abnormal   Collection Time: 12/16/18  2:26 AM  Result Value Ref Range   Lactic Acid, Venous 2.8 (HH) 0.5 - 1.9 mmol/L    Comment: CRITICAL RESULT CALLED TO, READ BACK BY AND VERIFIED WITH: VOGT,S,RN @ 0257 12/16/18 BY GWYN,P Performed at [facility], [address] [place], [place] [address]   Urinalysis, Microscopic (reflex)     Status: Abnormal   Collection Time: 12/16/18  2:26 AM  Result Value Ref Range   RBC / HPF 21-50 0 - 5 RBC/hpf   WBC, UA >50 0 - 5 WBC/hpf   Bacteria, UA MANY (A) NONE SEEN   Squamous Epithelial / LPF 11-20 0 - 5   Mucus PRESENT     Comment: Performed at [facility], [address] [place], [place] [address]  Rapid urine drug screen (hospital performed)     Status: Abnormal   Collection Time: 12/16/18  2:26 AM  Result Value Ref Range   Opiates NONE DETECTED NONE DETECTED   Cocaine NONE DETECTED NONE DETECTED   Benzodiazepines NONE DETECTED NONE DETECTED   Amphetamines POSITIVE (A) NONE DETECTED   Tetrahydrocannabinol POSITIVE (A) NONE DETECTED   Barbiturates NONE DETECTED NONE DETECTED    Comment: (NOTE) DRUG SCREEN FOR MEDICAL PURPOSES ONLY.  IF CONFIRMATION IS NEEDED FOR ANY PURPOSE, NOTIFY LAB WITHIN 5 DAYS. LOWEST DETECTABLE LIMITS FOR URINE DRUG SCREEN Drug Class                     Cutoff (ng/mL) Amphetamine and  metabolites    1000 Barbiturate and metabolites    200 Benzodiazepine  200 Tricyclics and metabolites     300 Opiates and metabolites        300 Cocaine and metabolites        300 THC                            50 Performed at [facility], [address] [place], [place] [address]    Ct Abdomen Pelvis W Contrast  Result Date: 12/16/2018 CLINICAL DATA:  Nausea, vomiting left sided abdominal pain, left flank pain, fever EXAM: CT ABDOMEN AND PELVIS WITH CONTRAST TECHNIQUE: Multidetector CT imaging of the abdomen and pelvis was performed using the standard protocol following bolus administration of intravenous contrast. CONTRAST:  OMNIPAQUE IOHEXOL 300 MG/ML  SOLN COMPARISON:  None. FINDINGS: Lower chest: Faint reticular/tree in bud opacities in the right lower lobe. No pleural fluid. Hepatobiliary: 3 mm hypodensity in the left in the liver is too small to characterize. No suspicious hepatic lesion. Gallbladder physiologically distended, no calcified stone. No biliary dilatation. Pancreas: No ductal dilatation or inflammation. Spleen: Heterogeneous parenchyma with innumerable subcentimeter low-density lesions. Overall normal in size. Adrenals/Urinary Tract: Normal adrenal glands. Heterogeneous decreased density within the upper medial and anterior mid left kidney consistent with pyelonephritis. No drainable renal abscess. Two small nonobstructing stones in the left kidney. No left hydronephrosis. There is left ureteral thickening and enhancement. Homogeneous enhancement of the right kidney without right hydronephrosis. Diffuse bladder wall thickening with perivesicular edema. Stomach/Bowel: Stomach is within normal limits. Appendix appears normal. No evidence of bowel wall thickening, distention, or inflammatory changes. Moderate colonic stool burden throughout the entire colon. Vascular/Lymphatic: Normal caliber abdominal aorta. Portal and splenic veins are patent. No acute  vascular abnormality. No enlarged lymph nodes in the abdomen or pelvis. Reproductive: Retroverted uterus. No adnexal mass. Other: Small amount of complex fluid in the pelvis may be reactive or physiologic. Generalized stranding of the pelvic fat. No free air or intra-abdominal abscess. Small fat containing umbilical hernia. Musculoskeletal: There are no acute or suspicious osseous abnormalities. IMPRESSION: 1. Cystitis and left pyelonephritis. No drainable renal abscess. 2. Nonobstructing left nephrolithiasis. 3. Innumerable subcentimeter low-density lesions throughout the spleen. This may be infectious and represent micro abscesses, including fungal. This does not have the appearance of perfusion abnormalities. 4. Tree in bud opacities in the right lower lobe suspicious for bronchiolitis. Electronically Signed   By: Narda Rutherford M.D.   On: 12/16/2018 03:48    Pending Labs Unresulted Labs (From admission, onward)    Start     Ordered   12/16/18 0603  Lactic acid, plasma  Now then every 2 hours,   STAT     12/16/18 0602   12/16/18 0422  Urine Culture  Add-on,   R    Comments:  Please get clean catch specimen ONLY (DO NOT obtain from urinal)    12/16/18 0421   12/16/18 0302  Blood culture (routine x 2)  BLOOD CULTURE X 2,   STAT     12/16/18 0301          Vitals/Pain Today's Vitals   12/16/18 0330 12/16/18 0406 12/16/18 0529 12/16/18 0609  BP:  (!) 94/49 (!) 97/53 (!) 92/52  Pulse:  (!) 102 94 82  Resp:  16  (!) 21  Temp:  98.9 F (37.2 C)    TempSrc:  Oral    SpO2:  98%  99%  Weight:      Height:  PainSc: 4  4       Isolation Precautions No active isolations  Medications Medications  sodium chloride flush (NS) 0.9 % injection 3 mL (has no administration in time range)  0.9 %  sodium chloride infusion ( Intravenous New Bag/Given 12/16/18 0418)  acetaminophen (TYLENOL) tablet 1,000 mg (1,000 mg Oral Given 12/16/18 0251)  sodium chloride 0.9 % bolus 1,000 mL (0 mLs  Intravenous Stopped 12/16/18 0402)  ondansetron (ZOFRAN) injection 4 mg (4 mg Intravenous Given 12/16/18 0254)  ketorolac (TORADOL) 30 MG/ML injection 30 mg (30 mg Intravenous Given 12/16/18 0258)  iohexol (OMNIPAQUE) 300 MG/ML solution 100 mL (100 mLs Intravenous Contrast Given 12/16/18 0313)  sodium chloride 0.9 % bolus 1,000 mL (0 mLs Intravenous Stopped 12/16/18 0415)  cefTRIAXone (ROCEPHIN) 1 g in sodium chloride 0.9 % 100 mL IVPB (0 g Intravenous Stopped 12/16/18 0401)  sodium chloride 0.9 % bolus 1,000 mL (0 mLs Intravenous Stopped 12/16/18 0524)  vancomycin (VANCOCIN) IVPB 1000 mg/200 mL premix (0 mg Intravenous Stopped 12/16/18 0539)    Mobility walks Low fall risk   Focused Assessments Renal Assessment Handoff:  Hemodialysis Schedule:  Last Hemodialysis date and time:    Restricted appendage:       R Recommendations: See Admitting Provider Note  Report given to: Zoe,RN  Additional Notes:

## 2018-12-16 NOTE — ED Notes (Signed)
ED Provider at bedside. 

## 2018-12-16 NOTE — ED Notes (Signed)
Pt in NAD, RR even and unlabored. Call bell within reach. 

## 2018-12-16 NOTE — ED Provider Notes (Signed)
TIME SEEN: 2:39 AM  CHIEF COMPLAINT: Subjective fevers, dysuria, abdominal pain  HPI: Patient is a 33 year old female with previous history of IV drug abuse who presents to the emergency department with subjective fevers, chills, dysuria and urinary frequency and urgency, left-sided flank and abdominal pain that started yesterday.  Has had nausea and vomiting.  No diarrhea.  No sick contacts or recent travel.  Has previously had pyelonephritis.  No history of kidney stones.  States she has not used IV drugs in several years.  No chest pain or shortness of breath.  ROS: See HPI Constitutional:  fever  Eyes: no drainage  ENT: no runny nose   Cardiovascular:  no chest pain  Resp: no SOB  GI:  vomiting GU:  dysuria Integumentary: no rash  Allergy: no hives  Musculoskeletal: no leg swelling  Neurological: no slurred speech ROS otherwise negative  PAST MEDICAL HISTORY/PAST SURGICAL HISTORY:  Past Medical History:  Diagnosis Date  . Drug abuse, IV (HCC)   . Thyroid disease     MEDICATIONS:  Prior to Admission medications   Medication Sig Start Date End Date Taking? Authorizing Provider  ibuprofen (ADVIL,MOTRIN) 800 MG tablet Take 1 tablet (800 mg total) by mouth every 8 (eight) hours as needed. 05/13/17   Long, Arlyss Repress, MD  methocarbamol (ROBAXIN) 500 MG tablet Take 1 tablet (500 mg total) by mouth 2 (two) times daily. 05/13/17   Long, Arlyss Repress, MD    ALLERGIES:  No Known Allergies  SOCIAL HISTORY:  Social History   Tobacco Use  . Smoking status: Current Every Day Smoker    Packs/day: 0.50  Substance Use Topics  . Alcohol use: No    FAMILY HISTORY: No family history on file.  EXAM: BP (!) 109/59   Pulse (!) 126   Temp 99 F (37.2 C) (Oral)   Resp 20   Ht 5\' 5"  (1.651 m)   Wt 59 kg   SpO2 99%   BMI 21.63 kg/m  CONSTITUTIONAL: Alert and oriented and responds appropriately to questions. Well-appearing; well-nourished HEAD: Normocephalic EYES: Conjunctivae clear,  pupils appear equal, EOMI ENT: normal nose; moist mucous membranes NECK: Supple, no meningismus, no nuchal rigidity, no LAD  CARD: Regular and tachycardic; S1 and S2 appreciated; no murmurs, no clicks, no rubs, no gallops RESP: Normal chest excursion without splinting or tachypnea; breath sounds clear and equal bilaterally; no wheezes, no rhonchi, no rales, no hypoxia or respiratory distress, speaking full sentences ABD/GI: Normal bowel sounds; non-distended; soft, tender throughout the left abdomen, no rebound, no guarding, no peritoneal signs, no hepatosplenomegaly BACK:  The back appears normal and is non-tender to palpation, patient has left CVA tenderness EXT: Normal ROM in all joints; non-tender to palpation; no edema; normal capillary refill; no cyanosis, no calf tenderness or swelling    SKIN: Normal color for age and race; warm; no rash NEURO: Moves all extremities equally PSYCH: The patient's mood and manner are appropriate. Grooming and personal hygiene are appropriate.  MEDICAL DECISION MAKING: Patient here with what I suspect is pyelonephritis.  She feels very warm to touch and has an oral temperature of 99.  Heart rate is 126.  Will obtain labs, lactate, CT of abdomen pelvis, urine and urine culture.  Will give IV fluids, Tylenol, Toradol, Zofran.  ED PROGRESS: Patient has a leukocytosis of 24,000.  Lactate elevated at 2.8.  Will order second liter of IV fluids.  Urine appears infected.  Will give IV ceftriaxone.  We do not have a  previous urine culture.  We will send blood cultures as well.  CT of the abdomen pelvis pending.  I feel patient will need admission.  3:55 AM  Pt's CT scan shows cystitis and left pyelonephritis but no renal abscess.  She has nonobstructing nephrolithiasis but no ureterolithiasis.  Also innumerable subcentimeter low-density lesions throughout the spleen that may be infectious in nature representing microabscesses.  Also has opacities in the right lower lobe  suspicious for bronchiolitis but she denies any recent cough, shortness of breath.  Blood cultures pending.  Given IV ceftriaxone in the ED.  Will discuss with medicine at [facility] long for admission.   4:30 AM Discussed patient's case with hospitalist, Dr. Clyde Lundborg.  I have recommended admission and patient (and family if present) agree with this plan. Admitting physician will place admission orders.   Broaden antibiotic coverage with IV vancomycin as well.  She is receiving 1/3 L of IV fluids.  I reviewed all nursing notes, vitals, pertinent previous records, EKGs, lab and urine results, imaging (as available).  .   CRITICAL CARE Performed by: Rochele Raring   Total critical care time: 65 minutes  Critical care time was exclusive of separately billable procedures and treating other patients.  Critical care was necessary to treat or prevent imminent or life-threatening deterioration.  Critical care was time spent personally by me on the following activities: development of treatment plan with patient and/or surrogate as well as nursing, discussions with consultants, evaluation of patient's response to treatment, examination of patient, obtaining history from patient or surrogate, ordering and performing treatments and interventions, ordering and review of laboratory studies, ordering and review of radiographic studies, pulse oximetry and re-evaluation of patient's condition.    Yahsir Wickens, Layla Maw, DO 12/16/18 0430

## 2018-12-16 NOTE — ED Notes (Signed)
carelink called for transport to [id number] at 1306

## 2018-12-16 NOTE — ED Triage Notes (Signed)
Pt states that for the past two days she has been experiencing nausea and vomiting with frequent urination and dysuria. States she is experiencing lower left abdominal pain. Subjective fever and chills.

## 2018-12-16 NOTE — ED Notes (Signed)
Pt's mother here to pick up pt's son. Care link called for transport.

## 2018-12-17 DIAGNOSIS — B9620 Unspecified Escherichia coli [E. coli] as the cause of diseases classified elsewhere: Secondary | ICD-10-CM

## 2018-12-17 DIAGNOSIS — N200 Calculus of kidney: Secondary | ICD-10-CM

## 2018-12-17 DIAGNOSIS — D733 Abscess of spleen: Secondary | ICD-10-CM

## 2018-12-17 DIAGNOSIS — N390 Urinary tract infection, site not specified: Secondary | ICD-10-CM

## 2018-12-17 DIAGNOSIS — F17210 Nicotine dependence, cigarettes, uncomplicated: Secondary | ICD-10-CM

## 2018-12-17 LAB — CBC
HEMATOCRIT: 30.4 % — AB (ref 36.0–46.0)
HEMOGLOBIN: 9.8 g/dL — AB (ref 12.0–15.0)
MCH: 30.4 pg (ref 26.0–34.0)
MCHC: 32.2 g/dL (ref 30.0–36.0)
MCV: 94.4 fL (ref 80.0–100.0)
Platelets: 127 10*3/uL — ABNORMAL LOW (ref 150–400)
RBC: 3.22 MIL/uL — ABNORMAL LOW (ref 3.87–5.11)
RDW: 12.7 % (ref 11.5–15.5)
WBC: 12.8 10*3/uL — AB (ref 4.0–10.5)
nRBC: 0 % (ref 0.0–0.2)

## 2018-12-17 LAB — COMPREHENSIVE METABOLIC PANEL
ALT: 12 U/L (ref 0–44)
AST: 14 U/L — ABNORMAL LOW (ref 15–41)
Albumin: 2.6 g/dL — ABNORMAL LOW (ref 3.5–5.0)
Alkaline Phosphatase: 38 U/L (ref 38–126)
Anion gap: 5 (ref 5–15)
BILIRUBIN TOTAL: 0.7 mg/dL (ref 0.3–1.2)
BUN: 8 mg/dL (ref 6–20)
CHLORIDE: 111 mmol/L (ref 98–111)
CO2: 21 mmol/L — ABNORMAL LOW (ref 22–32)
Calcium: 7.5 mg/dL — ABNORMAL LOW (ref 8.9–10.3)
Creatinine, Ser: 0.53 mg/dL (ref 0.44–1.00)
GFR calc Af Amer: >60 mL/min (ref >60)
GFR calc non Af Amer: >60 mL/min (ref >60)
Glucose, Bld: 116 mg/dL — ABNORMAL HIGH (ref 70–99)
POTASSIUM: 3.2 mmol/L — AB (ref 3.5–5.1)
Sodium: 137 mmol/L (ref 135–145)
TOTAL PROTEIN: 5.4 g/dL — AB (ref 6.5–8.1)

## 2018-12-17 LAB — BLOOD CULTURE ID PANEL (REFLEXED)
Acinetobacter baumannii: NOT DETECTED
Candida albicans: NOT DETECTED
Candida glabrata: NOT DETECTED
Candida krusei: NOT DETECTED
Candida parapsilosis: NOT DETECTED
Candida tropicalis: NOT DETECTED
Carbapenem resistance: NOT DETECTED
Enterobacter cloacae complex: NOT DETECTED
Enterobacteriaceae species: DETECTED — AB
Enterococcus species: NOT DETECTED
Escherichia coli: DETECTED — AB
Haemophilus influenzae: NOT DETECTED
Klebsiella oxytoca: NOT DETECTED
Klebsiella pneumoniae: NOT DETECTED
Listeria monocytogenes: NOT DETECTED
Neisseria meningitidis: NOT DETECTED
Proteus species: NOT DETECTED
Pseudomonas aeruginosa: NOT DETECTED
STAPHYLOCOCCUS AUREUS BCID: NOT DETECTED
Serratia marcescens: NOT DETECTED
Staphylococcus species: NOT DETECTED
Streptococcus agalactiae: NOT DETECTED
Streptococcus pneumoniae: NOT DETECTED
Streptococcus pyogenes: NOT DETECTED
Streptococcus species: NOT DETECTED

## 2018-12-17 LAB — HIV ANTIBODY (ROUTINE TESTING W REFLEX): HIV Screen 4th Generation wRfx: NONREACTIVE

## 2018-12-17 MED ORDER — PIPERACILLIN-TAZOBACTAM 3.375 G IVPB
3.3750 g | Freq: Once | INTRAVENOUS | Status: AC
  Administered 2018-12-17: 3.375 g via INTRAVENOUS
  Filled 2018-12-17: qty 50

## 2018-12-17 MED ORDER — METRONIDAZOLE IN NACL 5-0.79 MG/ML-% IV SOLN: 500.0000 mg | Freq: Three times a day (TID) | INTRAVENOUS | Status: DC

## 2018-12-17 MED ORDER — PIPERACILLIN-TAZOBACTAM 3.375 G IVPB: 3.3750 g | Freq: Three times a day (TID) | INTRAVENOUS | Status: DC

## 2018-12-17 MED ORDER — MORPHINE SULFATE (PF) 2 MG/ML IV SOLN
2.0000 mg | INTRAVENOUS | Status: DC | PRN
  Administered 2018-12-17 (×2): 2 mg via INTRAVENOUS
  Filled 2018-12-17 (×2): qty 1

## 2018-12-17 MED ORDER — SODIUM CHLORIDE 0.9 % IV SOLN
2.0000 g | INTRAVENOUS | Status: DC
  Administered 2018-12-17 – 2018-12-19 (×3): 2 g via INTRAVENOUS
  Filled 2018-12-17 (×3): qty 2

## 2018-12-17 NOTE — Progress Notes (Signed)
Pharmacy Antibiotic Note  Hannah Bailey is a 33 y.o. female admitted on 12/16/2018 with intra-abdominal infection.  Pharmacy has been consulted for zosyn dosing.  Plan: Zosyn 3.375g IV q8h (4 hour infusion).  F/u scr/cultures  Height: 5\' 5"  (165.1 cm) Weight: 130 lb (59 kg) IBW/kg (Calculated) : 57  Temp (24hrs), Avg:98.9 F (37.2 C), Min:98 F (36.7 C), Max:100.2 F (37.9 C)  Recent Labs  Lab 12/16/18 0226 12/16/18 0605 12/16/18 1541 12/17/18 0421  WBC 24.5*  --  18.2*  --   CREATININE 0.88  --  0.51 0.53  LATICACIDVEN 2.8* 0.9  --   --     Estimated Creatinine Clearance: 92.5 mL/min (by C-G formula based on SCr of 0.53 mg/dL).    No Known Allergies  Antimicrobials this admission: 3/8 zosyn >>    >>   Dose adjustments this admission:   Microbiology results:  BCx:   UCx:    Sputum:    MRSA PCR:   Thank you for allowing pharmacy to be a part of this patient's care.  Lorenza Evangelist 12/17/2018 5:47 AM

## 2018-12-17 NOTE — Progress Notes (Signed)
PHARMACY - PHYSICIAN COMMUNICATION CRITICAL VALUE ALERT - BLOOD CULTURE IDENTIFICATION (BCID)  Hannah Bailey is an 33 y.o. female who presented to [facility] on 12/16/2018 with a chief complaint of abd pain, N/V, chills/fever, dysuria.  Assessment:  To [facility] 3/7 am, transfer to [facility] 3/7 pm. Abd CT with non-obstructing L renal stone, L pyelonephritis, Cystitis. Also splenic lesions, r/o infection (bacterial or fungal) - BCID E coli in 1/4 bottles (aerobic)  Name of physician (or Provider) Contacted: Dr Margo Aye  Current antibiotics: Received Rocephin 1gm x1 at [facility], pt transferred to [facility] 3/7 pm, no further Rocephin orders (Rocephin planned, order not entered). Zosyn requested per pharmacy 3/8 am. BCID resulted 3/8 am.  Changes to prescribed antibiotics recommended: change to Rocephin 2gm q24   Results for orders placed or performed during the hospital encounter of 12/16/18  Blood Culture ID Panel (Reflexed) (Collected: 12/16/2018  3:10 AM)  Result Value Ref Range   Enterococcus species NOT DETECTED NOT DETECTED   Listeria monocytogenes NOT DETECTED NOT DETECTED   Staphylococcus species NOT DETECTED NOT DETECTED   Staphylococcus aureus (BCID) NOT DETECTED NOT DETECTED   Streptococcus species NOT DETECTED NOT DETECTED   Streptococcus agalactiae NOT DETECTED NOT DETECTED   Streptococcus pneumoniae NOT DETECTED NOT DETECTED   Streptococcus pyogenes NOT DETECTED NOT DETECTED   Acinetobacter baumannii NOT DETECTED NOT DETECTED   Enterobacteriaceae species DETECTED (A) NOT DETECTED   Enterobacter cloacae complex NOT DETECTED NOT DETECTED   Escherichia coli DETECTED (A) NOT DETECTED   Klebsiella oxytoca NOT DETECTED NOT DETECTED   Klebsiella pneumoniae NOT DETECTED NOT DETECTED   Proteus species NOT DETECTED NOT DETECTED   Serratia marcescens NOT DETECTED NOT DETECTED   Carbapenem resistance NOT DETECTED NOT DETECTED   Haemophilus influenzae NOT DETECTED NOT DETECTED   Neisseria meningitidis NOT  DETECTED NOT DETECTED   Pseudomonas aeruginosa NOT DETECTED NOT DETECTED   Candida albicans NOT DETECTED NOT DETECTED   Candida glabrata NOT DETECTED NOT DETECTED   Candida krusei NOT DETECTED NOT DETECTED   Candida parapsilosis NOT DETECTED NOT DETECTED   Candida tropicalis NOT DETECTED NOT DETECTED    Otho Bellows 12/17/2018  6:39 AM

## 2018-12-17 NOTE — Progress Notes (Signed)
PROGRESS NOTE  CHEMISE AMARO GUR:[id number] DOB: 1986/03/01 DOA: 12/16/2018 PCP: Patient, No Pcp Per  HPI/Recap of past 24 hours: The patient is a 33 yr old woman with past medical history significant for IV drug use, polysubstance abuse including methamphetamine and THC who presented to [facility] ED with complaints of dysuria earlier of a few days duration.  Associated with chills and night sweats.  Upon arrival at [facility], vital signs unremarkable, WBC 18.2.  Positive UA for pyuria.  CT abdomen and pelvis done on 12/16/2018 demonstrted cystitis and left pyelonephritis. No drainable abscess was identified. There was non-obstructing left nephrolithiasis. There were also innumerable subcentimeter low density lesions throughout the spleen. These could be micro-abscesses, including fungal. There are tree in bud opacities in the right lower lobe suspicious for bronchiolitis.  TRH asked to admit.  12/17/18: Patient seen and examined at her bedside.  Reports persistent chills and night sweats.  Independently reviewed CT abdomen and pelvis with contrast done on admission which revealed splenic abscess.  Started on IV Zosyn empirically.  Blood cultures x2+ for E. coli and Enterobacteriaceae species.  Antibiotics switched from Zosyn to Rocephin 2 g daily.  Added IV Flagyl for anaerobic coverage to initiate treatment for suspected splenic abscess.  Patient admits to history of IV drug use with methamphetamine.  States last time she used methamphetamine was 1 month ago through snorting.  UDS done on 12/16/2018 positive for amphetamine and THC.  Curb sided with Dr. Ezzard Standing on 12/17/2018 who independently reviewed imaging.  No indication for drainage at this point, continue IV antibiotics.  Will consult infectious disease.   Assessment/Plan: Active Problems:   Pyelonephritis   Bronchiolitis   Splenic abscess   IVDU (intravenous drug user)  Sepsis secondary to acute left pyelonephritis, E. coli/Enterobacteriacea  bacteremia Patient presented with dysuria, nausea, vomiting, left flank and abdominal pain Leukocytosis, tachycardia and tachypnea CT abdomen and pelvis with contrast on admission independently reviewed revealed signs of left pyelonephritis, splenic abscess, left nonobstructing nephrolithiasis, and right lower lobe possible bronchiolitis IV Rocephin 2 g daily IV Flagyl has been added for anaerobic coverage in the setting of splenic abscesses Sepsis physiology is persistent WBC 18.2K T-max overnight 100.2 Blood cultures x2 peripherally grew Enterobacteracea and E. coli Urine culture grew greater than 100,000 E. coli colonies Fungal blood culture in process Awaiting sensitivities results Continue to monitor fever curve and WBC Repeat CBC in the morning Consult infectious disease  E. coli/Enterobacteriaceae bacteremia  Patient admits to prior use of IV drug methamphetamine States has not used IV drug in months Positive UDS for amphetamine and THC on 12/16/2018 Management as stated above  Splenic abscesses Curb sided with general surgery Dr. Ezzard Standing who reviewed imaging Not amenable to drainage at this time Surgery recommended continue IV antibiotics Fungal blood culture in process Continue to monitor  Left nonobstructive nephrolithiasis Continue IV fluid Continue to monitor urine output Pain management in place as needed  Suspected right lower lobe bronchiolitis Continue IV antibiotics O2 sat stable on room air  Polysubstance abuse/history of IV drug abuser UDS done on 12/16/2018+ for amphetamine and THC Polysubstance abuse cessation counseling at bedside Case manager to assist with resources when close to discharge     Code Status: Full code  Family Communication: None at bedside  Disposition Plan: Home when clinically stable and infectious disease has signed off.   Consultants:  Infectious disease  Procedures:  None  Antimicrobials:  Rocephin 2 g daily and IV  Flagyl 500 mg 3  times daily  DVT prophylaxis: Subcu Lovenox daily   Objective: Vitals:   12/16/18 2153 12/17/18 0149 12/17/18 0424 12/17/18 0933  BP: 109/62 137/77 123/63 94/63  Pulse: (!) 102 (!) 118 (!) 107 79  Resp: 16 (!) Temp: 98.6 F (37 C) 100.2 F (37.9 C) 100 F (37.8 C) 98 F (36.7 C)  TempSrc: Oral Oral Oral Oral  SpO2: 100% 99% 97% 100%  Weight:      Height:        Intake/Output Summary (Last 24 hours) at 12/17/2018 1054 Last data filed at 12/17/2018 0600 Gross per 24 hour  Intake 3170.78 ml  Output 1450 ml  Net 1720.78 ml   Filed Weights   12/16/18 0213  Weight: 59 kg    Exam:  . General: 33 y.o. year-old female well developed well nourished in no acute distress.  Alert and oriented x3. . Cardiovascular: Regular rate and rhythm with no rubs or gallops.  No thyromegaly or JVD noted.   Marland Kitchen Respiratory: Clear to auscultation with no wheezes or rales. Good inspiratory effort. . Abdomen: Soft nontender nondistended with normal bowel sounds x4 quadrants.  Left flank pain tenderness. . Musculoskeletal: No lower extremity edema. 2/4 pulses in all 4 extremities. Marland Kitchen Psychiatry: Mood is appropriate for condition and setting   Data Reviewed: CBC: Recent Labs  Lab 12/16/18 0226 12/16/18 1541 12/17/18 0421  WBC 24.5* 18.2* 12.8*  HGB 12.8 10.9* 9.8*  HCT 38.4 33.5* 30.4*  MCV 90.4 94.4 94.4  PLT 185 145* 127*   Basic Metabolic Panel: Recent Labs  Lab 12/16/18 0226 12/16/18 1541 12/17/18 0421  NA 130*  --  137  K 3.5  --  3.2*  CL 96*  --  111  CO2 24  --  21*  GLUCOSE 182*  --  116*  BUN 15  --  8  CREATININE 0.88 0.51 0.53  CALCIUM 8.9  --  7.5*   GFR: Estimated Creatinine Clearance: 92.5 mL/min (by C-G formula based on SCr of 0.53 mg/dL). Liver Function Tests: Recent Labs  Lab 12/16/18 0226 12/17/18 0421  AST 21 14*  ALT 13 12  ALKPHOS 45 38  BILITOT 1.2 0.7  PROT 7.6 5.4*  ALBUMIN 3.8 2.6*   No results for input(s): LIPASE,  AMYLASE in the last 168 hours. No results for input(s): AMMONIA in the last 168 hours. Coagulation Profile: No results for input(s): INR, PROTIME in the last 168 hours. Cardiac Enzymes: No results for input(s): CKTOTAL, CKMB, CKMBINDEX, TROPONINI in the last 168 hours. BNP (last 3 results) No results for input(s): PROBNP in the last 8760 hours. HbA1C: No results for input(s): HGBA1C in the last 72 hours. CBG: No results for input(s): GLUCAP in the last 168 hours. Lipid Profile: No results for input(s): CHOL, HDL, LDLCALC, TRIG, CHOLHDL, LDLDIRECT in the last 72 hours. Thyroid Function Tests: No results for input(s): TSH, T4TOTAL, FREET4, T3FREE, THYROIDAB in the last 72 hours. Anemia Panel: No results for input(s): VITAMINB12, FOLATE, FERRITIN, TIBC, IRON, RETICCTPCT in the last 72 hours. Urine analysis:    Component Value Date/Time   COLORURINE YELLOW 12/16/2018 0226   APPEARANCEUR CLOUDY (A) 12/16/2018 0226   LABSPEC 1.025 12/16/2018 0226   PHURINE 6.0 12/16/2018 0226   GLUCOSEU NEGATIVE 12/16/2018 0226   HGBUR LARGE (A) 12/16/2018 0226   BILIRUBINUR SMALL (A) 12/16/2018 0226   KETONESUR NEGATIVE 12/16/2018 0226   PROTEINUR 100 (A) 12/16/2018 0226   UROBILINOGEN 0.2 09/10/2013 1434   NITRITE NEGATIVE  12/16/2018 0226   LEUKOCYTESUR MODERATE (A) 12/16/2018 0226   Sepsis Labs: (procalcitonin:4,lacticidven:4)  ) Recent Results (from the past 240 hour(s))  Urine Culture     Status: Abnormal (Preliminary result)   Collection Time: 12/16/18  2:26 AM  Result Value Ref Range Status   Specimen Description   Final    URINE, RANDOM Performed at [facility], [address] [place], [place] [address]    Special Requests   Final    NONE Performed at [facility], [address] [place], [place] [address]    Culture (A)  Final    >=100,000 COLONIES/mL ESCHERICHIA COLI SUSCEPTIBILITIES TO FOLLOW Performed at [facility], [address] [place], [place] [address]    Report Status PENDING  Incomplete  Blood culture (routine x 2)     Status: None (Preliminary result)   Collection Time: 12/16/18  3:10 AM  Result Value Ref Range Status   Specimen Description   Final    BLOOD LEFT ARM Performed at [facility], [address] [place], [place] [address]    Special Requests   Final    BOTTLES DRAWN AEROBIC AND ANAEROBIC Blood Culture adequate volume Performed at [facility], [address] [place], [place] [address]    Culture  Setup Time   Final    GRAM NEGATIVE RODS AEROBIC BOTTLE ONLY CRITICAL RESULT CALLED TO, READ BACK BY AND VERIFIED WITH: T. GREEN,PHARMD 12/17/2018 Girtha Hake Performed at [facility], [address], [place], [place] [address]    Culture GRAM NEGATIVE RODS  Final   Report Status PENDING  Incomplete  Blood Culture ID Panel (Reflexed)     Status: Abnormal   Collection Time: 12/16/18  3:10 AM  Result Value Ref Range Status   Enterococcus species NOT DETECTED NOT DETECTED Final   Listeria monocytogenes NOT DETECTED NOT DETECTED Final   Staphylococcus species NOT DETECTED NOT DETECTED Final   Staphylococcus aureus (BCID) NOT DETECTED NOT DETECTED Final   Streptococcus species NOT DETECTED NOT DETECTED Final   Streptococcus agalactiae NOT DETECTED NOT DETECTED Final   Streptococcus pneumoniae NOT DETECTED NOT DETECTED Final   Streptococcus pyogenes NOT DETECTED NOT DETECTED Final   Acinetobacter baumannii NOT DETECTED NOT DETECTED Final   Enterobacteriaceae species DETECTED (A) NOT DETECTED Final    Comment: Enterobacteriaceae represent a large family of gram-negative bacteria, not a single organism. CRITICAL RESULT CALLED TO, READ BACK BY AND VERIFIED WITH: T. GREEN,PHARMD [id number] 12/17/2018 T. TYSOR    Enterobacter cloacae complex NOT DETECTED NOT DETECTED Final   Escherichia coli DETECTED (A) NOT DETECTED Final    Comment: CRITICAL RESULT CALLED TO, READ BACK BY AND  VERIFIED WITH: T. GREEN,PHARMD [id number] 12/17/2018 T. TYSOR    Klebsiella oxytoca NOT DETECTED NOT DETECTED Final   Klebsiella pneumoniae NOT DETECTED NOT DETECTED Final   Proteus species NOT DETECTED NOT DETECTED Final   Serratia marcescens NOT DETECTED NOT DETECTED Final   Carbapenem resistance NOT DETECTED NOT DETECTED Final   Haemophilus influenzae NOT DETECTED NOT DETECTED Final   Neisseria meningitidis NOT DETECTED NOT DETECTED Final   Pseudomonas aeruginosa NOT DETECTED NOT DETECTED Final   Candida albicans NOT DETECTED NOT DETECTED Final   Candida glabrata NOT DETECTED NOT DETECTED Final   Candida krusei NOT DETECTED NOT DETECTED Final   Candida parapsilosis NOT DETECTED NOT DETECTED Final   Candida tropicalis NOT DETECTED NOT DETECTED Final  Comment: Performed at [facility], [address], [place], [place] [address]  Blood culture (routine x 2)     Status: None (Preliminary result)   Collection Time: 12/16/18  3:24 AM  Result Value Ref Range Status   Specimen Description   Final    BLOOD RIGHT ARM Performed at [facility], [address] [place], [place] [address]    Special Requests   Final    BOTTLES DRAWN AEROBIC AND ANAEROBIC Blood Culture adequate volume Performed at [facility], [address] [place], [place] [address]    Culture   Final    NO GROWTH < 24 HOURS Performed at [facility], [address], [place], [place] [address]    Report Status PENDING  Incomplete  Fungus culture, blood     Status: None (Preliminary result)   Collection Time: 12/16/18  3:41 PM  Result Value Ref Range Status   Specimen Description   Final    BLOOD RIGHT HAND Performed at [facility], [address], [place], [place] [address]    Special Requests   Final    BOTTLES DRAWN AEROBIC AND ANAEROBIC Blood Culture adequate volume Performed at [facility], [address], [place], [place] [address]     Culture   Final    NO GROWTH < 12 HOURS Performed at [facility], [address] [place], [place] [address]    Report Status PENDING  Incomplete  MRSA PCR Screening     Status: None   Collection Time: 12/16/18  3:52 PM  Result Value Ref Range Status   MRSA by PCR NEGATIVE NEGATIVE Final    Comment:        The GeneXpert MRSA Assay (FDA approved for NASAL specimens only), is one component of a comprehensive MRSA colonization surveillance program. It is not intended to diagnose MRSA infection nor to guide or monitor treatment for MRSA infections. Performed at [facility], [address], [place], [place] [address]       Studies: No results found.  Scheduled Meds: . enoxaparin (LOVENOX) injection  40 mg Subcutaneous Q24H  . methocarbamol  500 mg Oral BID  . sodium chloride flush  3 mL Intravenous Once    Continuous Infusions: . sodium chloride 125 mL/hr at 12/17/18 0938  . cefTRIAXone (ROCEPHIN)  IV    . metronidazole       LOS: 1 day     Darlin Drop, MD [facility] Pager [phone]  If 7PM-7AM, please contact night-coverage [email or web address] Password [facility] 12/17/2018, 10:54 AM

## 2018-12-17 NOTE — Consult Note (Signed)
[facility]       Reason for Consult: splenic abscesses, pyelonephritis    Referring Physician: Dr. Margo Aye  Active Problems:   Pyelonephritis   Bronchiolitis   Splenic abscess   IVDU (intravenous drug user)   . enoxaparin (LOVENOX) injection  40 mg Subcutaneous Q24H  . methocarbamol  500 mg Oral BID  . sodium chloride flush  3 mL Intravenous Once    Recommendations: Continue ceftriaxone I have stopped flagyl ] HIV pending Will add on hepatitis C   Assessment: She has pyelonephritis and splenic abscesses and E coli in blood and urine cultures.  No surgery indicated at this time per surgery but will need prolonged antibiotics and monitoring for resolution.  Will need surgery input if she worsens.  Bronchiolitis on CT but asymptomatic.    Dr. Ninetta Lights on tomorrow  Antibiotics: Ceftriaxone Day 3 total antibiotics  HPI: Hannah Bailey is a 33 y.o. female with remote history of IVDU and several days of left-sided abdominal pain, dysuria, fever, rigors and CT findings with pyelonephritis, non-obstructing kidney stones and numerous small splenic lesions.  E coli in blood and urine.  Continues to have rigors.  WBC initialy 24.5, now 12.8.  Tmax 100.4.     Review of Systems:  Constitutional: positive for fevers, chills, malaise, sweats and anorexia or negative for weight loss Gastrointestinal: positive for abdominal pain, negative for nausea Genitourinary: negative for frequency Integument/breast: negative for rash All other systems reviewed and are negative    Past Medical History:  Diagnosis Date  . Drug abuse, IV (HCC)   . Thyroid disease     Social History   Tobacco Use  . Smoking status: Current Every Day Smoker    Packs/day: 0.50    Types: Cigarettes  . Smokeless tobacco: Never Used  Substance Use Topics  . Alcohol use: No  . Drug use: Yes    Types: Methamphetamines, Cocaine, IV    FMH: + cardiac disease  No Known  Allergies  Physical Exam: Constitutional: in no apparent distress  Vitals:   12/17/18 1144 12/17/18 1309  BP:  120/78  Pulse:  (!) 109  Resp:  16  Temp: 99.5 F (37.5 C) (!) 100.4 F (38 C)  SpO2:  97%   EYES: anicteric ENMT: no thrush Cardiovascular: Cor RRR Respiratory: CTA B; normal respiratory effort GI: soft, tenderness of left side, no guarding, no rebound Musculoskeletal: no pedal edema noted Skin: negatives: no rash Neuro: non-focal  Lab Results  Component Value Date   WBC 12.8 (H) 12/17/2018   HGB 9.8 (L) 12/17/2018   HCT 30.4 (L) 12/17/2018   MCV 94.4 12/17/2018   PLT 127 (L) 12/17/2018    Lab Results  Component Value Date   CREATININE 0.53 12/17/2018   BUN 8 12/17/2018   NA 137 12/17/2018   K 3.2 (L) 12/17/2018   CL 111 12/17/2018   CO2 21 (L) 12/17/2018    Lab Results  Component Value Date   ALT 12 12/17/2018   AST 14 (L) 12/17/2018   ALKPHOS 38 12/17/2018     Microbiology: Recent Results (from the past 240 hour(s))  Urine Culture     Status: Abnormal (Preliminary result)   Collection Time: 12/16/18  2:26 AM  Result Value Ref Range Status   Specimen Description   Final    URINE, RANDOM Performed at [facility], [address] [place], [place] [address]    Special Requests   Final  NONE Performed at [facility], [address] [place], [place] [address]    Culture (A)  Final    >=100,000 COLONIES/mL ESCHERICHIA COLI SUSCEPTIBILITIES TO FOLLOW Performed at [facility], [address] [place], [place] [address]    Report Status PENDING  Incomplete  Blood culture (routine x 2)     Status: None (Preliminary result)   Collection Time: 12/16/18  3:10 AM  Result Value Ref Range Status   Specimen Description   Final    BLOOD LEFT ARM Performed at [facility], [address] [place], [place] [address]    Special Requests   Final    BOTTLES DRAWN AEROBIC AND ANAEROBIC Blood Culture adequate  volume Performed at [facility], [address] [place], [place] [address]    Culture  Setup Time   Final    GRAM NEGATIVE RODS AEROBIC BOTTLE ONLY CRITICAL RESULT CALLED TO, READ BACK BY AND VERIFIED WITH: T. GREEN,PHARMD 12/17/2018 Girtha Hake Performed at [facility], [address], [place], [place] [address]    Culture GRAM NEGATIVE RODS  Final   Report Status PENDING  Incomplete  Blood Culture ID Panel (Reflexed)     Status: Abnormal   Collection Time: 12/16/18  3:10 AM  Result Value Ref Range Status   Enterococcus species NOT DETECTED NOT DETECTED Final   Listeria monocytogenes NOT DETECTED NOT DETECTED Final   Staphylococcus species NOT DETECTED NOT DETECTED Final   Staphylococcus aureus (BCID) NOT DETECTED NOT DETECTED Final   Streptococcus species NOT DETECTED NOT DETECTED Final   Streptococcus agalactiae NOT DETECTED NOT DETECTED Final   Streptococcus pneumoniae NOT DETECTED NOT DETECTED Final   Streptococcus pyogenes NOT DETECTED NOT DETECTED Final   Acinetobacter baumannii NOT DETECTED NOT DETECTED Final   Enterobacteriaceae species DETECTED (A) NOT DETECTED Final    Comment: Enterobacteriaceae represent a large family of gram-negative bacteria, not a single organism. CRITICAL RESULT CALLED TO, READ BACK BY AND VERIFIED WITH: T. GREEN,PHARMD [id number] 12/17/2018 T. TYSOR    Enterobacter cloacae complex NOT DETECTED NOT DETECTED Final   Escherichia coli DETECTED (A) NOT DETECTED Final    Comment: CRITICAL RESULT CALLED TO, READ BACK BY AND VERIFIED WITH: T. Lelon Mast 0511 12/17/2018 T. TYSOR    Klebsiella oxytoca NOT DETECTED NOT DETECTED Final   Klebsiella pneumoniae NOT DETECTED NOT DETECTED Final   Proteus species NOT DETECTED NOT DETECTED Final   Serratia marcescens NOT DETECTED NOT DETECTED Final   Carbapenem resistance NOT DETECTED NOT DETECTED Final   Haemophilus influenzae NOT DETECTED NOT DETECTED Final   Neisseria meningitidis NOT DETECTED  NOT DETECTED Final   Pseudomonas aeruginosa NOT DETECTED NOT DETECTED Final   Candida albicans NOT DETECTED NOT DETECTED Final   Candida glabrata NOT DETECTED NOT DETECTED Final   Candida krusei NOT DETECTED NOT DETECTED Final   Candida parapsilosis NOT DETECTED NOT DETECTED Final   Candida tropicalis NOT DETECTED NOT DETECTED Final    Comment: Performed at [facility], [address], [place], [place] [address]  Blood culture (routine x 2)     Status: None (Preliminary result)   Collection Time: 12/16/18  3:24 AM  Result Value Ref Range Status   Specimen Description   Final    BLOOD RIGHT ARM Performed at [facility], [address], [place], [place] [address]    Special Requests   Final    BOTTLES DRAWN AEROBIC  AND ANAEROBIC Blood Culture adequate volume Performed at [facility], [address] [place], [place] [address]    Culture   Final    NO GROWTH < 24 HOURS Performed at [facility], [address] [place], [place] [address]    Report Status PENDING  Incomplete  Fungus culture, blood     Status: None (Preliminary result)   Collection Time: 12/16/18  3:41 PM  Result Value Ref Range Status   Specimen Description   Final    BLOOD RIGHT HAND Performed at [facility], [address], [place], [place] [address]    Special Requests   Final    BOTTLES DRAWN AEROBIC AND ANAEROBIC Blood Culture adequate volume Performed at [facility], [address] [place], [place] [address]    Culture   Final    NO GROWTH < 12 HOURS Performed at [facility], [address], [place], [place] [address]    Report Status PENDING  Incomplete  MRSA PCR Screening     Status: None   Collection Time: 12/16/18  3:52 PM  Result Value Ref Range Status   MRSA by PCR NEGATIVE NEGATIVE Final    Comment:        The GeneXpert MRSA Assay (FDA approved for NASAL specimens only), is one component of a comprehensive  MRSA colonization surveillance program. It is not intended to diagnose MRSA infection nor to guide or monitor treatment for MRSA infections. Performed at [facility], [address] [place], [place] [address]     Gardiner Barefoot, MD [facility] [email or web address] 12/17/2018, 2:40 PM

## 2018-12-18 DIAGNOSIS — R7881 Bacteremia: Secondary | ICD-10-CM

## 2018-12-18 DIAGNOSIS — N12 Tubulo-interstitial nephritis, not specified as acute or chronic: Secondary | ICD-10-CM

## 2018-12-18 DIAGNOSIS — F1990 Other psychoactive substance use, unspecified, uncomplicated: Secondary | ICD-10-CM

## 2018-12-18 LAB — CBC
HCT: 31.4 % — ABNORMAL LOW (ref 36.0–46.0)
HEMOGLOBIN: 9.9 g/dL — AB (ref 12.0–15.0)
MCH: 30.3 pg (ref 26.0–34.0)
MCHC: 31.5 g/dL (ref 30.0–36.0)
MCV: 96 fL (ref 80.0–100.0)
Platelets: 154 10*3/uL (ref 150–400)
RBC: 3.27 MIL/uL — ABNORMAL LOW (ref 3.87–5.11)
RDW: 12.6 % (ref 11.5–15.5)
WBC: 8.1 10*3/uL (ref 4.0–10.5)
nRBC: 0 % (ref 0.0–0.2)

## 2018-12-18 LAB — BASIC METABOLIC PANEL
ANION GAP: 6 (ref 5–15)
BUN: <5 mg/dL — ABNORMAL LOW (ref 6–20)
CHLORIDE: 109 mmol/L (ref 98–111)
CO2: 24 mmol/L (ref 22–32)
Calcium: 8.1 mg/dL — ABNORMAL LOW (ref 8.9–10.3)
Creatinine, Ser: 0.59 mg/dL (ref 0.44–1.00)
GFR calc Af Amer: >60 mL/min (ref >60)
GFR calc non Af Amer: >60 mL/min (ref >60)
GLUCOSE: 95 mg/dL (ref 70–99)
Potassium: 3.3 mmol/L — ABNORMAL LOW (ref 3.5–5.1)
Sodium: 139 mmol/L (ref 135–145)

## 2018-12-18 LAB — URINE CULTURE: Culture: >=100000 — AB

## 2018-12-18 LAB — MAGNESIUM: Magnesium: 1.6 mg/dL — ABNORMAL LOW (ref 1.7–2.4)

## 2018-12-18 LAB — HCV COMMENT:

## 2018-12-18 LAB — HEPATITIS C ANTIBODY (REFLEX): HCV Ab: <0.1 s/co ratio (ref 0.0–0.9)

## 2018-12-18 MED ORDER — NICOTINE 21 MG/24HR TD PT24
21.0000 mg | MEDICATED_PATCH | Freq: Every day | TRANSDERMAL | Status: DC
  Administered 2018-12-18: 21 mg via TRANSDERMAL
  Filled 2018-12-18 (×2): qty 1

## 2018-12-18 MED ORDER — FLEET ENEMA 7-19 GM/118ML RE ENEM
1.0000 | ENEMA | Freq: Once | RECTAL | Status: DC
  Filled 2018-12-18: qty 1

## 2018-12-18 MED ORDER — MAGNESIUM SULFATE 4 GM/100ML IV SOLN
4.0000 g | Freq: Once | INTRAVENOUS | Status: AC
  Administered 2018-12-18: 4 g via INTRAVENOUS
  Filled 2018-12-18: qty 100

## 2018-12-18 MED ORDER — OXYCODONE HCL 5 MG PO TABS
5.0000 mg | ORAL_TABLET | Freq: Four times a day (QID) | ORAL | Status: DC | PRN
  Administered 2018-12-18 – 2018-12-19 (×2): 5 mg via ORAL
  Filled 2018-12-18 (×2): qty 1

## 2018-12-18 MED ORDER — POTASSIUM CHLORIDE CRYS ER 20 MEQ PO TBCR
40.0000 meq | EXTENDED_RELEASE_TABLET | Freq: Once | ORAL | Status: AC
  Administered 2018-12-18: 40 meq via ORAL
  Filled 2018-12-18: qty 2

## 2018-12-18 MED ORDER — SENNOSIDES-DOCUSATE SODIUM 8.6-50 MG PO TABS
2.0000 | ORAL_TABLET | Freq: Two times a day (BID) | ORAL | Status: DC
  Administered 2018-12-18 – 2018-12-21 (×5): 2 via ORAL
  Filled 2018-12-18 (×6): qty 2

## 2018-12-18 MED ORDER — HYDROMORPHONE HCL 1 MG/ML IJ SOLN: 1.0000 mg | INTRAMUSCULAR | Status: DC | PRN

## 2018-12-18 NOTE — Progress Notes (Signed)
PROGRESS NOTE  Hannah Bailey TSV:[id number] DOB: May 14, 1986 DOA: 12/16/2018 PCP: Patient, No Pcp Per  HPI/Recap of past 24 hours: The patient is a 33 yr old woman with past medical history significant for IV drug use, polysubstance abuse including methamphetamine and THC who presented to [facility] ED with complaints of dysuria earlier of a few days duration.  Associated with chills and night sweats.  Upon arrival at [facility], vital signs unremarkable, WBC 18.2.  Positive UA for pyuria.  CT abdomen and pelvis done on 12/16/2018 demonstrted cystitis and left pyelonephritis. No drainable abscess was identified. There was non-obstructing left nephrolithiasis. There were also innumerable subcentimeter low density lesions throughout the spleen. These could be micro-abscesses, including fungal. There are tree in bud opacities in the right lower lobe suspicious for bronchiolitis.  TRH asked to admit.  12/17/18: Patient seen and examined at her bedside.  Reports persistent chills and night sweats.  Independently reviewed CT abdomen and pelvis with contrast done on admission which revealed splenic abscess.  Started on IV Zosyn empirically.  Blood cultures x2+ for E. coli and Enterobacteriaceae species.  Antibiotics switched from Zosyn to Rocephin 2 g daily.  Added IV Flagyl for anaerobic coverage to initiate treatment for suspected splenic abscess.  Patient admits to history of IV drug use with methamphetamine.  States last time she used methamphetamine was 1 month ago through snorting.  UDS done on 12/16/2018 positive for amphetamine and THC.  Curb sided with Dr. Ezzard Standing on 12/17/2018 who independently reviewed imaging.  No indication for drainage at this point, continue IV antibiotics.  Will consult infectious disease.  12/18/18: Seen and examined at her bedside.  No acute events overnight.  States morphine gives her headache.  Switched to IV Dilaudid as needed for severe left flank pain.  Continues to have night sweats but  chills have resolved.  T-max 100.3 overnight.   Assessment/Plan: Active Problems:   Pyelonephritis   Bronchiolitis   Splenic abscess   IVDU (intravenous drug user)  Sepsis secondary to acute left pyelonephritis, E. coli bacteremia, splenic abscesses, right lower lobe bronchiolitis Patient presented with dysuria, nausea, vomiting, left flank and abdominal pain Leukocytosis, tachycardia and tachypnea CT abdomen and pelvis with contrast on admission independently reviewed revealed signs of left pyelonephritis, splenic abscess, left nonobstructing nephrolithiasis, and right lower lobe possible bronchiolitis Blood cultures 1 out of 2 bottles positive for E. coli, awaiting sensitivities  Continue IV Rocephin 2 g daily Stopped IV Flagyl per ID HIV screening negative MRSA negative Hepatitis C screening pending Fungus culture no growth in 2 days T-max 100.3 overnight No leukocytosis this morning  Continue to monitor fever curve and WBC   Hypokalemia Potassium 3.3 Repleted with KCl p.o. 40 mg x 2  Hypomagnesemia Magnesium 1.6 repleted with IV magnesium 4 g once  E. Coli UTI and bacteremia Patient admits to prior use of IV drug methamphetamine States has not used IV drug in months Positive UDS for amphetamine and THC on 12/16/2018 Urine culture and blood culture positive for E. Coli Awaiting sensitivities Continue Rocephin 2 g daily  Splenic abscesses Curb sided with general surgery Dr. Ezzard Standing who reviewed imaging Not amenable to drainage at this time Surgery recommended continue IV antibiotics Fungal blood culture in process Continue to monitor  Left nonobstructive nephrolithiasis Continue IV fluid Continue to monitor urine output Pain management in place IV Dilaudid as needed severe pain Oxycodone for moderate pain Tylenol for mild pain Bowel regimen in place to prevent opiate-induced constipation  Right  lower lobe bronchiolitis Asymptomatic Continue IV antibiotics O2  sat stable on room air  Polysubstance abuse/history of IV drug abuser UDS done on 12/16/2018+ for amphetamine and THC Polysubstance abuse cessation counseling at bedside Case manager to assist with resources when close to discharge     Code Status: Full code  Family Communication: None at bedside  Disposition Plan: Home when clinically stable and infectious disease has signed off.   Consultants:  Infectious disease  Procedures:  None  Antimicrobials:  Rocephin 2 g daily and IV Flagyl 500 mg 3 times daily  DVT prophylaxis: Subcu Lovenox daily   Objective: Vitals:   12/17/18 1144 12/17/18 1309 12/17/18 2110 12/18/18 0351  BP:  120/78 126/78 122/81  Pulse:  (!) 109 (!) 106 89  Resp:  Temp: 99.5 F (37.5 C) (!) 100.4 F (38 C) 100.3 F (37.9 C) 99.1 F (37.3 C)  TempSrc: Oral Oral Oral Oral  SpO2:  97% 100% 93%  Weight:      Height:        Intake/Output Summary (Last 24 hours) at 12/18/2018 0915 Last data filed at 12/18/2018 0606 Gross per 24 hour  Intake 2800.4 ml  Output 1700 ml  Net 1100.4 ml   Filed Weights   12/16/18 0213  Weight: 59 kg    Exam:  . General: 33 y.o. year-old female well-developed well-nourished no acute distress.  Alert and oriented x3. . Cardiovascular: Regular rate and rhythm with no rubs or gallops.  No JVD or thyromegaly . Respiratory: Clear to station with no wheezes or rales.  Good inspiratory effort. . Abdomen: Soft nontender nondistended with normal bowel sounds x4 quadrants.  . Musculoskeletal: No lower extremity edema. 2/4 pulses in all 4 extremities. Marland Kitchen Psychiatry: Mood is appropriate for condition and setting   Data Reviewed: CBC: Recent Labs  Lab 12/16/18 0226 12/16/18 1541 12/17/18 0421 12/18/18 0634  WBC 24.5* 18.2* 12.8* 8.1  HGB 12.8 10.9* 9.8* 9.9*  HCT 38.4 33.5* 30.4* 31.4*  MCV 90.4 94.4 94.4 96.0  PLT 185 145* 127* 154   Basic Metabolic Panel: Recent Labs  Lab 12/16/18 0226  12/16/18 1541 12/17/18 0421 12/18/18 0634  NA 130*  --  137 139  K 3.5  --  3.2* 3.3*  CL 96*  --  111 109  CO2 24  --  21* 24  GLUCOSE 182*  --  116* 95  BUN 15  --  8 <5*  CREATININE 0.88 0.51 0.53 0.59  CALCIUM 8.9  --  7.5* 8.1*  MG  --   --   --  1.6*   GFR: Estimated Creatinine Clearance: 92.5 mL/min (by C-G formula based on SCr of 0.59 mg/dL). Liver Function Tests: Recent Labs  Lab 12/16/18 0226 12/17/18 0421  AST 21 14*  ALT 13 12  ALKPHOS 45 38  BILITOT 1.2 0.7  PROT 7.6 5.4*  ALBUMIN 3.8 2.6*   No results for input(s): LIPASE, AMYLASE in the last 168 hours. No results for input(s): AMMONIA in the last 168 hours. Coagulation Profile: No results for input(s): INR, PROTIME in the last 168 hours. Cardiac Enzymes: No results for input(s): CKTOTAL, CKMB, CKMBINDEX, TROPONINI in the last 168 hours. BNP (last 3 results) No results for input(s): PROBNP in the last 8760 hours. HbA1C: No results for input(s): HGBA1C in the last 72 hours. CBG: No results for input(s): GLUCAP in the last 168 hours. Lipid Profile: No results for input(s): CHOL, HDL, LDLCALC, TRIG, CHOLHDL, LDLDIRECT in  the last 72 hours. Thyroid Function Tests: No results for input(s): TSH, T4TOTAL, FREET4, T3FREE, THYROIDAB in the last 72 hours. Anemia Panel: No results for input(s): VITAMINB12, FOLATE, FERRITIN, TIBC, IRON, RETICCTPCT in the last 72 hours. Urine analysis:    Component Value Date/Time   COLORURINE YELLOW 12/16/2018 0226   APPEARANCEUR CLOUDY (A) 12/16/2018 0226   LABSPEC 1.025 12/16/2018 0226   PHURINE 6.0 12/16/2018 0226   GLUCOSEU NEGATIVE 12/16/2018 0226   HGBUR LARGE (A) 12/16/2018 0226   BILIRUBINUR SMALL (A) 12/16/2018 0226   KETONESUR NEGATIVE 12/16/2018 0226   PROTEINUR 100 (A) 12/16/2018 0226   UROBILINOGEN 0.2 09/10/2013 1434   NITRITE NEGATIVE 12/16/2018 0226   LEUKOCYTESUR MODERATE (A) 12/16/2018 0226   Sepsis  Labs: (procalcitonin:4,lacticidven:4)  ) Recent Results (from the past 240 hour(s))  Urine Culture     Status: Abnormal   Collection Time: 12/16/18  2:26 AM  Result Value Ref Range Status   Specimen Description   Final    URINE, RANDOM Performed at [facility], [address] [place], [place] [address]    Special Requests   Final    NONE Performed at [facility], [address] [place], [place] [address]    Culture >=100,000 COLONIES/mL ESCHERICHIA COLI (A)  Final   Report Status 12/18/2018 FINAL  Final   Organism ID, Bacteria ESCHERICHIA COLI (A)  Final      Susceptibility   Escherichia coli - MIC*    AMPICILLIN >=32 RESISTANT Resistant     CEFAZOLIN <=4 SENSITIVE Sensitive     CEFTRIAXONE <=1 SENSITIVE Sensitive     CIPROFLOXACIN <=0.25 SENSITIVE Sensitive     GENTAMICIN <=1 SENSITIVE Sensitive     IMIPENEM <=0.25 SENSITIVE Sensitive     NITROFURANTOIN <=16 SENSITIVE Sensitive     TRIMETH/SULFA <=20 SENSITIVE Sensitive     AMPICILLIN/SULBACTAM >=32 RESISTANT Resistant     PIP/TAZO <=4 SENSITIVE Sensitive     Extended ESBL NEGATIVE Sensitive     * >=100,000 COLONIES/mL ESCHERICHIA COLI  Blood culture (routine x 2)     Status: Abnormal (Preliminary result)   Collection Time: 12/16/18  3:10 AM  Result Value Ref Range Status   Specimen Description   Final    BLOOD LEFT ARM Performed at [facility], [address] [place], [place] [address]    Special Requests   Final    BOTTLES DRAWN AEROBIC AND ANAEROBIC Blood Culture adequate volume Performed at [facility], [address] [place], [place] [address]    Culture  Setup Time   Final    GRAM NEGATIVE RODS AEROBIC BOTTLE ONLY CRITICAL RESULT CALLED TO, READ BACK BY AND VERIFIED WITH: T. GREEN,PHARMD 12/17/2018 T. TYSOR    Culture (A)  Final    ESCHERICHIA COLI SUSCEPTIBILITIES TO FOLLOW Performed at [facility], [address] [place], [place]  [address]    Report Status PENDING  Incomplete  Blood Culture ID Panel (Reflexed)     Status: Abnormal   Collection Time: 12/16/18  3:10 AM  Result Value Ref Range Status   Enterococcus species NOT DETECTED NOT DETECTED Final   Listeria monocytogenes NOT DETECTED NOT DETECTED Final   Staphylococcus species NOT DETECTED NOT DETECTED Final   Staphylococcus aureus (BCID) NOT DETECTED NOT DETECTED Final   Streptococcus species NOT DETECTED NOT DETECTED Final   Streptococcus agalactiae NOT DETECTED NOT DETECTED Final   Streptococcus pneumoniae NOT DETECTED NOT DETECTED Final  Streptococcus pyogenes NOT DETECTED NOT DETECTED Final   Acinetobacter baumannii NOT DETECTED NOT DETECTED Final   Enterobacteriaceae species DETECTED (A) NOT DETECTED Final    Comment: Enterobacteriaceae represent a large family of gram-negative bacteria, not a single organism. CRITICAL RESULT CALLED TO, READ BACK BY AND VERIFIED WITH: T. GREEN,PHARMD 1610 12/17/2018 T. TYSOR    Enterobacter cloacae complex NOT DETECTED NOT DETECTED Final   Escherichia coli DETECTED (A) NOT DETECTED Final    Comment: CRITICAL RESULT CALLED TO, READ BACK BY AND VERIFIED WITH: T. Lelon Mast [id number] 12/17/2018 T. TYSOR    Klebsiella oxytoca NOT DETECTED NOT DETECTED Final   Klebsiella pneumoniae NOT DETECTED NOT DETECTED Final   Proteus species NOT DETECTED NOT DETECTED Final   Serratia marcescens NOT DETECTED NOT DETECTED Final   Carbapenem resistance NOT DETECTED NOT DETECTED Final   Haemophilus influenzae NOT DETECTED NOT DETECTED Final   Neisseria meningitidis NOT DETECTED NOT DETECTED Final   Pseudomonas aeruginosa NOT DETECTED NOT DETECTED Final   Candida albicans NOT DETECTED NOT DETECTED Final   Candida glabrata NOT DETECTED NOT DETECTED Final   Candida krusei NOT DETECTED NOT DETECTED Final   Candida parapsilosis NOT DETECTED NOT DETECTED Final   Candida tropicalis NOT DETECTED NOT DETECTED Final    Comment: Performed at  [facility], [address] [place], [place] [address]  Blood culture (routine x 2)     Status: None (Preliminary result)   Collection Time: 12/16/18  3:24 AM  Result Value Ref Range Status   Specimen Description   Final    BLOOD RIGHT ARM Performed at [facility], [address] [place], [place] [address]    Special Requests   Final    BOTTLES DRAWN AEROBIC AND ANAEROBIC Blood Culture adequate volume Performed at [facility], [address] [place], [place] [address]    Culture   Final    NO GROWTH 2 DAYS Performed at [facility], [address], [place], [place] [address]    Report Status PENDING  Incomplete  Fungus culture, blood     Status: None (Preliminary result)   Collection Time: 12/16/18  3:41 PM  Result Value Ref Range Status   Specimen Description   Final    BLOOD RIGHT HAND Performed at [facility], [address], [place], [place] [address]    Special Requests   Final    BOTTLES DRAWN AEROBIC AND ANAEROBIC Blood Culture adequate volume Performed at [facility], [address] [place], [place] [address]    Culture   Final    NO GROWTH 2 DAYS Performed at [facility], [address] [place], [place] [address]    Report Status PENDING  Incomplete  MRSA PCR Screening     Status: None   Collection Time: 12/16/18  3:52 PM  Result Value Ref Range Status   MRSA by PCR NEGATIVE NEGATIVE Final    Comment:        The GeneXpert MRSA Assay (FDA approved for NASAL specimens only), is one component of a comprehensive MRSA colonization surveillance program. It is not intended to diagnose MRSA infection nor to guide or monitor treatment for MRSA infections. Performed at [facility], [address] [place], [place] [address]       Studies: No results found.  Scheduled Meds: . enoxaparin (LOVENOX) injection  40 mg Subcutaneous Q24H  . methocarbamol  500 mg  Oral  BID  . nicotine  21 mg Transdermal Daily  . sodium chloride flush  3 mL Intravenous Once    Continuous Infusions: . sodium chloride 75 mL/hr at 12/18/18 0549  . cefTRIAXone (ROCEPHIN)  IV 2 g (12/17/18 1155)     LOS: 2 days     Darlin Drop, MD [facility] Pager [phone]  If 7PM-7AM, please contact night-coverage [email or web address] Password [email or web address] 12/18/2018, 9:15 AM

## 2018-12-18 NOTE — Progress Notes (Signed)
INFECTIOUS DISEASE PROGRESS NOTE  ID: Hannah Bailey is a 33 y.o. female with  Active Problems:   Pyelonephritis   Bronchiolitis   Splenic abscess   IVDU (intravenous drug user)  Subjective: No c/o Needs to urinate. No dysuria.   Abtx:  Anti-infectives (From admission, onward)   Start     Dose/Rate Route Frequency Ordered Stop   12/17/18 1400  piperacillin-tazobactam (ZOSYN) IVPB 3.375 g  Status:  Discontinued     3.375 g 12.5 mL/hr over 240 Minutes Intravenous Every 8 hours 12/17/18 0548 12/17/18 0716   12/17/18 1300  metroNIDAZOLE (FLAGYL) IVPB 500 mg  Status:  Discontinued     500 mg 100 mL/hr over 60 Minutes Intravenous Every 8 hours 12/17/18 1052 12/17/18 1253   12/17/18 1200  cefTRIAXone (ROCEPHIN) 2 g in sodium chloride 0.9 % 100 mL IVPB     2 g 200 mL/hr over 30 Minutes Intravenous Every 24 hours 12/17/18 0716     12/17/18 0545  piperacillin-tazobactam (ZOSYN) IVPB 3.375 g     3.375 g 100 mL/hr over 30 Minutes Intravenous  Once 12/17/18 0541 12/17/18 0710   12/16/18 0430  vancomycin (VANCOCIN) IVPB 1000 mg/200 mL premix     1,000 mg 200 mL/hr over 60 Minutes Intravenous  Once 12/16/18 0429 12/16/18 0539   12/16/18 0300  cefTRIAXone (ROCEPHIN) 1 g in sodium chloride 0.9 % 100 mL IVPB     1 g 200 mL/hr over 30 Minutes Intravenous  Once 12/16/18 0259 12/16/18 0401      Medications:  Scheduled: . enoxaparin (LOVENOX) injection  40 mg Subcutaneous Q24H  . methocarbamol  500 mg Oral BID  . nicotine  21 mg Transdermal Daily  . senna-docusate  2 tablet Oral BID  . sodium chloride flush  3 mL Intravenous Once    Objective: Vital signs in last 24 hours: Temp:  [99.1 F (37.3 C)-100.4 F (38 C)] 99.1 F (37.3 C) (03/09 0351) Pulse Rate:  [89-109] 89 (03/09 0351) Resp:  [15-16] 15 (03/09 0351) BP: (120-126)/(78-81) 122/81 (03/09 0351) SpO2:  [93 %-100 %] 93 % (03/09 0351)   General appearance: alert, cooperative and no distress Resp: clear to auscultation  bilaterally Cardio: regular rate and rhythm GI: normal findings: bowel sounds normal and soft, non-tender Extremities: edema none  Lab Results Recent Labs    12/17/18 0421 12/18/18 0634  WBC 12.8* 8.1  HGB 9.8* 9.9*  HCT 30.4* 31.4*  NA 137 139  K 3.2* 3.3*  CL 111 109  CO2 21* 24  BUN 8 <5*  CREATININE 0.53 0.59   Liver Panel Recent Labs    12/16/18 0226 12/17/18 0421  PROT 7.6 5.4*  ALBUMIN 3.8 2.6*  AST 21 14*  ALT 13 12  ALKPHOS 45 38  BILITOT 1.2 0.7   Sedimentation Rate No results for input(s): ESRSEDRATE in the last 72 hours. C-Reactive Protein No results for input(s): CRP in the last 72 hours.  Microbiology: Recent Results (from the past 240 hour(s))  Urine Culture     Status: Abnormal   Collection Time: 12/16/18  2:26 AM  Result Value Ref Range Status   Specimen Description   Final    URINE, RANDOM Performed at [facility], [address] [place], [place] [address]    Special Requests   Final    NONE Performed at [facility], [address] [place], [place] [address]    Culture >=100,000 COLONIES/mL ESCHERICHIA COLI (A)  Final  Report Status 12/18/2018 FINAL  Final   Organism ID, Bacteria ESCHERICHIA COLI (A)  Final      Susceptibility   Escherichia coli - MIC*    AMPICILLIN >=32 RESISTANT Resistant     CEFAZOLIN <=4 SENSITIVE Sensitive     CEFTRIAXONE <=1 SENSITIVE Sensitive     CIPROFLOXACIN <=0.25 SENSITIVE Sensitive     GENTAMICIN <=1 SENSITIVE Sensitive     IMIPENEM <=0.25 SENSITIVE Sensitive     NITROFURANTOIN <=16 SENSITIVE Sensitive     TRIMETH/SULFA <=20 SENSITIVE Sensitive     AMPICILLIN/SULBACTAM >=32 RESISTANT Resistant     PIP/TAZO <=4 SENSITIVE Sensitive     Extended ESBL NEGATIVE Sensitive     * >=100,000 COLONIES/mL ESCHERICHIA COLI  Blood culture (routine x 2)     Status: Abnormal (Preliminary result)   Collection Time: 12/16/18  3:10 AM  Result Value Ref Range Status   Specimen Description    Final    BLOOD LEFT ARM Performed at [facility], [address] [place], [place] [address]    Special Requests   Final    BOTTLES DRAWN AEROBIC AND ANAEROBIC Blood Culture adequate volume Performed at [facility], [address] [place], [place] [address]    Culture  Setup Time   Final    GRAM NEGATIVE RODS AEROBIC BOTTLE ONLY CRITICAL RESULT CALLED TO, READ BACK BY AND VERIFIED WITH: T. GREEN,PHARMD 12/17/2018 T. TYSOR    Culture (A)  Final    ESCHERICHIA COLI SUSCEPTIBILITIES TO FOLLOW Performed at [facility], [address], [place], [place] [address]    Report Status PENDING  Incomplete  Blood Culture ID Panel (Reflexed)     Status: Abnormal   Collection Time: 12/16/18  3:10 AM  Result Value Ref Range Status   Enterococcus species NOT DETECTED NOT DETECTED Final   Listeria monocytogenes NOT DETECTED NOT DETECTED Final   Staphylococcus species NOT DETECTED NOT DETECTED Final   Staphylococcus aureus (BCID) NOT DETECTED NOT DETECTED Final   Streptococcus species NOT DETECTED NOT DETECTED Final   Streptococcus agalactiae NOT DETECTED NOT DETECTED Final   Streptococcus pneumoniae NOT DETECTED NOT DETECTED Final   Streptococcus pyogenes NOT DETECTED NOT DETECTED Final   Acinetobacter baumannii NOT DETECTED NOT DETECTED Final   Enterobacteriaceae species DETECTED (A) NOT DETECTED Final    Comment: Enterobacteriaceae represent a large family of gram-negative bacteria, not a single organism. CRITICAL RESULT CALLED TO, READ BACK BY AND VERIFIED WITH: T. GREEN,PHARMD [id number] 12/17/2018 T. TYSOR    Enterobacter cloacae complex NOT DETECTED NOT DETECTED Final   Escherichia coli DETECTED (A) NOT DETECTED Final    Comment: CRITICAL RESULT CALLED TO, READ BACK BY AND VERIFIED WITH: T. Lelon Mast [id number] 12/17/2018 T. TYSOR    Klebsiella oxytoca NOT DETECTED NOT DETECTED Final   Klebsiella pneumoniae NOT DETECTED NOT DETECTED Final   Proteus species NOT  DETECTED NOT DETECTED Final   Serratia marcescens NOT DETECTED NOT DETECTED Final   Carbapenem resistance NOT DETECTED NOT DETECTED Final   Haemophilus influenzae NOT DETECTED NOT DETECTED Final   Neisseria meningitidis NOT DETECTED NOT DETECTED Final   Pseudomonas aeruginosa NOT DETECTED NOT DETECTED Final   Candida albicans NOT DETECTED NOT DETECTED Final   Candida glabrata NOT DETECTED NOT DETECTED Final   Candida krusei NOT DETECTED NOT DETECTED Final   Candida parapsilosis NOT DETECTED NOT DETECTED Final   Candida tropicalis NOT DETECTED NOT DETECTED Final    Comment: Performed at [facility], [address], [place], [place] [address]  Blood culture (routine x 2)     Status: None (Preliminary result)   Collection Time: 12/16/18  3:24 AM  Result Value Ref Range Status   Specimen Description   Final    BLOOD RIGHT ARM Performed at [facility], [address] [place], [place] [address]    Special Requests   Final    BOTTLES DRAWN AEROBIC AND ANAEROBIC Blood Culture adequate volume Performed at [facility], [address] [place], [place] [address]    Culture   Final    NO GROWTH 2 DAYS Performed at [facility], [address], [place], [place] [address]    Report Status PENDING  Incomplete  Fungus culture, blood     Status: None (Preliminary result)   Collection Time: 12/16/18  3:41 PM  Result Value Ref Range Status   Specimen Description   Final    BLOOD RIGHT HAND Performed at [facility], [address] [place], [place] [address]    Special Requests   Final    BOTTLES DRAWN AEROBIC AND ANAEROBIC Blood Culture adequate volume Performed at [facility], [address], [place], [place] [address]    Culture   Final    NO GROWTH 2 DAYS Performed at [facility], [address] [place], [place] [address]    Report Status PENDING  Incomplete  MRSA PCR Screening     Status: None    Collection Time: 12/16/18  3:52 PM  Result Value Ref Range Status   MRSA by PCR NEGATIVE NEGATIVE Final    Comment:        The GeneXpert MRSA Assay (FDA approved for NASAL specimens only), is one component of a comprehensive MRSA colonization surveillance program. It is not intended to diagnose MRSA infection nor to guide or monitor treatment for MRSA infections. Performed at [facility], [address] [place], [place] [address]     Studies/Results: No results found.   Assessment/Plan: Splenic abscesses Pyelo E coli bacteremia, UTI Remote IVDU  Total days of antibiotics: 4 (ceftriaxone)  HIV (-) Will continue her current anbx Would recheck her CT after 1 week of therapy to decide if continued IV or po.  Hep C ab pending.          Johny Sax MD, FACP Infectious Diseases (pager) [phone] [email or web address] 12/18/2018, 12:46 PM  LOS: 2 days

## 2018-12-19 LAB — CULTURE, BLOOD (ROUTINE X 2): Special Requests: ADEQUATE

## 2018-12-19 LAB — BASIC METABOLIC PANEL
Anion gap: 7 (ref 5–15)
BUN: 5 mg/dL — ABNORMAL LOW (ref 6–20)
CALCIUM: 8.2 mg/dL — AB (ref 8.9–10.3)
CO2: 26 mmol/L (ref 22–32)
Chloride: 106 mmol/L (ref 98–111)
Creatinine, Ser: 0.5 mg/dL (ref 0.44–1.00)
GFR calc Af Amer: >60 mL/min (ref >60)
GFR calc non Af Amer: >60 mL/min (ref >60)
Glucose, Bld: 91 mg/dL (ref 70–99)
Potassium: 3.5 mmol/L (ref 3.5–5.1)
Sodium: 139 mmol/L (ref 135–145)

## 2018-12-19 MED ORDER — CEFAZOLIN SODIUM-DEXTROSE 2-4 GM/100ML-% IV SOLN
2.0000 g | Freq: Three times a day (TID) | INTRAVENOUS | Status: DC
  Administered 2018-12-20 – 2018-12-21 (×5): 2 g via INTRAVENOUS
  Filled 2018-12-19 (×6): qty 100

## 2018-12-19 MED ORDER — POTASSIUM CHLORIDE CRYS ER 20 MEQ PO TBCR
40.0000 meq | EXTENDED_RELEASE_TABLET | Freq: Once | ORAL | Status: AC
  Administered 2018-12-19: 40 meq via ORAL
  Filled 2018-12-19: qty 2

## 2018-12-19 MED ORDER — NICOTINE 14 MG/24HR TD PT24
14.0000 mg | MEDICATED_PATCH | Freq: Every day | TRANSDERMAL | Status: DC
  Administered 2018-12-19 – 2018-12-21 (×3): 14 mg via TRANSDERMAL
  Filled 2018-12-19 (×2): qty 1

## 2018-12-19 MED ORDER — NICOTINE 14 MG/24HR TD PT24
14.0000 mg | MEDICATED_PATCH | Freq: Every day | TRANSDERMAL | Status: DC
  Filled 2018-12-19: qty 1

## 2018-12-19 NOTE — Progress Notes (Signed)
PROGRESS NOTE  Hannah Bailey ZOX:[id number] DOB: [birth year] DOA: 12/16/2018 PCP: Patient, No Pcp Per  HPI/Recap of past 24 hours: The patient is a 33 yr old woman with past medical history significant for IV drug use, polysubstance abuse including methamphetamine and THC who presented to [facility] ED with complaints of dysuria of a few days duration.  Associated with chills and night sweats.  Upon arrival at [facility], vital signs unremarkable, WBC 18.2K.  Positive UA for pyuria.  CT abdomen and pelvis done on 12/16/2018 demonstrted cystitis and left pyelonephritis. No drainable abscess was identified. There was non-obstructing left nephrolithiasis. There were also innumerable subcentimeter low density lesions throughout the spleen. These could be micro-abscesses, including fungal. There are tree in bud opacities in the right lower lobe suspicious for bronchiolitis.  TRH asked to admit.  Hospital course complicated by E. coli bacteremia and splenic abscesses.  Curb sided with Dr. Ezzard Standing on 12/17/2018 who independently reviewed imaging and recommended conservative management with IV antibiotics.  To repeat imaging in the week, if persistent to reconsult general surgery.  Infectious disease consulted and is following.   12/19/18: Patient seen and examined at her bedside.  No acute events overnight.  She has no new complaints.  Day #5 of antibiotics.  Repeat CT abdomen and pelvis in 2 days as recommended by infectious disease.   Assessment/Plan: Active Problems:   Pyelonephritis   Bronchiolitis   Splenic abscess   IVDU (intravenous drug user)  Sepsis secondary to acute left pyelonephritis, E. coli bacteremia and UTI, splenic abscesses, right lower lobe bronchiolitis Patient presented with dysuria, nausea, vomiting, left flank and abdominal pain Leukocytosis, tachycardia and tachypnea CT abdomen and pelvis with contrast on admission independently reviewed revealed signs of left pyelonephritis, splenic  abscess, left nonobstructing nephrolithiasis, and right lower lobe possible bronchiolitis Blood cultures 1 out of 2 bottles positive for E. coli resistant to ampicillin and ampicillin sulbactam Continue IV Rocephin 2 g daily HIV screening negative Hepatitis C screening negative MRSA negative Fungus culture no growth in 3 days Repeat blood cultures x2 peripherally done on 12/19/2018 in process Afebrile with no leukocytosis in latest CBC Obtain CBC in the morning  E. Coli bacteremia, suspect urinary source from E. coli UTI and left pyelonephritis  Management as stated above  Resolved hypokalemia post repletion   Hypomagnesemia Magnesium 1.6 repleted with IV magnesium 4 g once Repeat magnesium level in the morning  Splenic abscesses Curb sided with general surgery Dr. Ezzard Standing who reviewed imaging Not amenable to drainage at this time Surgery recommended continue IV antibiotics and repeat imaging 1 week after starting antibiotics Fungal blood culture negative to date  Left nonobstructive nephrolithiasis Continue IV fluid Continue to monitor urine output Pain management in place IV Dilaudid as needed severe pain Oxycodone for moderate pain Tylenol for mild pain Bowel regimen in place to prevent opiate-induced constipation  Right lower lobe bronchiolitis Asymptomatic Continue IV antibiotics for bacteremia O2 sat stable on room air  Polysubstance abuse/history of IV drug abuser UDS done on 12/16/2018+ for amphetamine and THC Polysubstance abuse cessation counseling at bedside Case manager to assist with resources when close to discharge  Tobacco use disorder Tobacco cessation counseling Nicotine patch 14 mg patch daily     Code Status: Full code  Family Communication: None at bedside  Disposition Plan: Home when clinically stable and infectious disease has signed off.   Consultants:  Infectious disease  Procedures:  None  Antimicrobials:  Rocephin 2 g daily  DVT prophylaxis: Subcu Lovenox daily   Objective: Vitals:   12/18/18 1527 12/18/18 2059 12/19/18 0502 12/19/18 1255  BP: (!) 126/94 123/87 127/86 111/84  Pulse: 95 93 90 73  Resp: 18 14 14 19   Temp: 98.2 F (36.8 C) 98.8 F (37.1 C) 98 F (36.7 C) 98.2 F (36.8 C)  TempSrc: Oral Oral Oral Oral  SpO2: 100% 100% 100% 97%  Weight:      Height:        Intake/Output Summary (Last 24 hours) at 12/19/2018 1308 Last data filed at 12/19/2018 1256 Gross per 24 hour  Intake 2054.9 ml  Output 3000 ml  Net -945.1 ml   Filed Weights   12/16/18 0213  Weight: 59 kg    Exam:  . General: 33 y.o. year-old female well-developed well-nourished in no acute distress.  Alert and oriented x3. . Cardiovascular: Regular rate and rhythm with no rubs or gallops.  No JVD or thyromegaly . Respiratory: Clear to auscultation with no wheezes or rales.  Good inspiratory effort.. . Abdomen: Soft nontender nondistended with normal bowel sounds x4 quadrants.  . Musculoskeletal: No lower extremity edema. 2/4 pulses in all 4 extremities. Marland Kitchen Psychiatry: Mood is appropriate for condition and setting   Data Reviewed: CBC: Recent Labs  Lab 12/16/18 0226 12/16/18 1541 12/17/18 0421 12/18/18 0634  WBC 24.5* 18.2* 12.8* 8.1  HGB 12.8 10.9* 9.8* 9.9*  HCT 38.4 33.5* 30.4* 31.4*  MCV 90.4 94.4 94.4 96.0  PLT 185 145* 127* 154   Basic Metabolic Panel: Recent Labs  Lab 12/16/18 0226 12/16/18 1541 12/17/18 0421 12/18/18 0634 12/19/18 0447  NA 130*  --  137 139 139  K 3.5  --  3.2* 3.3* 3.5  CL 96*  --  111 109 106  CO2 24  --  21* 24 26  GLUCOSE 182*  --  116* 95 91  BUN 15  --  8 <5* 5*  CREATININE 0.88 0.51 0.53 0.59 0.50  CALCIUM 8.9  --  7.5* 8.1* 8.2*  MG  --   --   --  1.6*  --    GFR: Estimated Creatinine Clearance: 92.5 mL/min (by C-G formula based on SCr of 0.5 mg/dL). Liver Function Tests: Recent Labs  Lab 12/16/18 0226 12/17/18 0421  AST 21 14*  ALT 13 12  ALKPHOS 45 38   BILITOT 1.2 0.7  PROT 7.6 5.4*  ALBUMIN 3.8 2.6*   No results for input(s): LIPASE, AMYLASE in the last 168 hours. No results for input(s): AMMONIA in the last 168 hours. Coagulation Profile: No results for input(s): INR, PROTIME in the last 168 hours. Cardiac Enzymes: No results for input(s): CKTOTAL, CKMB, CKMBINDEX, TROPONINI in the last 168 hours. BNP (last 3 results) No results for input(s): PROBNP in the last 8760 hours. HbA1C: No results for input(s): HGBA1C in the last 72 hours. CBG: No results for input(s): GLUCAP in the last 168 hours. Lipid Profile: No results for input(s): CHOL, HDL, LDLCALC, TRIG, CHOLHDL, LDLDIRECT in the last 72 hours. Thyroid Function Tests: No results for input(s): TSH, T4TOTAL, FREET4, T3FREE, THYROIDAB in the last 72 hours. Anemia Panel: No results for input(s): VITAMINB12, FOLATE, FERRITIN, TIBC, IRON, RETICCTPCT in the last 72 hours. Urine analysis:    Component Value Date/Time   COLORURINE YELLOW 12/16/2018 0226   APPEARANCEUR CLOUDY (A) 12/16/2018 0226   LABSPEC 1.025 12/16/2018 0226   PHURINE 6.0 12/16/2018 0226   GLUCOSEU NEGATIVE 12/16/2018 0226   HGBUR LARGE (A) 12/16/2018 0226  BILIRUBINUR SMALL (A) 12/16/2018 0226   KETONESUR NEGATIVE 12/16/2018 0226   PROTEINUR 100 (A) 12/16/2018 0226   UROBILINOGEN 0.2 09/10/2013 1434   NITRITE NEGATIVE 12/16/2018 0226   LEUKOCYTESUR MODERATE (A) 12/16/2018 0226   Sepsis Labs: (procalcitonin:4,lacticidven:4)  ) Recent Results (from the past 240 hour(s))  Urine Culture     Status: Abnormal   Collection Time: 12/16/18  2:26 AM  Result Value Ref Range Status   Specimen Description   Final    URINE, RANDOM Performed at [facility], [address] [place], [place] [address]    Special Requests   Final    NONE Performed at [facility], [address] [place], [place] [address]    Culture >=100,000 COLONIES/mL ESCHERICHIA COLI (A)  Final   Report  Status 12/18/2018 FINAL  Final   Organism ID, Bacteria ESCHERICHIA COLI (A)  Final      Susceptibility   Escherichia coli - MIC*    AMPICILLIN >=32 RESISTANT Resistant     CEFAZOLIN <=4 SENSITIVE Sensitive     CEFTRIAXONE <=1 SENSITIVE Sensitive     CIPROFLOXACIN <=0.25 SENSITIVE Sensitive     GENTAMICIN <=1 SENSITIVE Sensitive     IMIPENEM <=0.25 SENSITIVE Sensitive     NITROFURANTOIN <=16 SENSITIVE Sensitive     TRIMETH/SULFA <=20 SENSITIVE Sensitive     AMPICILLIN/SULBACTAM >=32 RESISTANT Resistant     PIP/TAZO <=4 SENSITIVE Sensitive     Extended ESBL NEGATIVE Sensitive     * >=100,000 COLONIES/mL ESCHERICHIA COLI  Blood culture (routine x 2)     Status: Abnormal   Collection Time: 12/16/18  3:10 AM  Result Value Ref Range Status   Specimen Description   Final    BLOOD LEFT ARM Performed at [facility], [address] [place], [place] [address]    Special Requests   Final    BOTTLES DRAWN AEROBIC AND ANAEROBIC Blood Culture adequate volume Performed at [facility], [address] [place], [place] [address]    Culture  Setup Time   Final    GRAM NEGATIVE RODS AEROBIC BOTTLE ONLY CRITICAL RESULT CALLED TO, READ BACK BY AND VERIFIED WITH: T. GREEN,PHARMD 12/17/2018 Girtha Hake Performed at [facility], [address] [place], [place] [address]    Culture ESCHERICHIA COLI (A)  Final   Report Status 12/19/2018 FINAL  Final   Organism ID, Bacteria ESCHERICHIA COLI  Final      Susceptibility   Escherichia coli - MIC*    AMPICILLIN >=32 RESISTANT Resistant     CEFAZOLIN <=4 SENSITIVE Sensitive     CEFEPIME <=1 SENSITIVE Sensitive     CEFTAZIDIME <=1 SENSITIVE Sensitive     CEFTRIAXONE <=1 SENSITIVE Sensitive     CIPROFLOXACIN <=0.25 SENSITIVE Sensitive     GENTAMICIN <=1 SENSITIVE Sensitive     IMIPENEM <=0.25 SENSITIVE Sensitive     TRIMETH/SULFA <=20 SENSITIVE Sensitive     AMPICILLIN/SULBACTAM >=32 RESISTANT Resistant     PIP/TAZO <=4  SENSITIVE Sensitive     Extended ESBL NEGATIVE Sensitive     * ESCHERICHIA COLI  Blood Culture ID Panel (Reflexed)     Status: Abnormal   Collection Time: 12/16/18  3:10 AM  Result Value Ref Range Status   Enterococcus species NOT DETECTED NOT DETECTED Final   Listeria monocytogenes NOT DETECTED NOT DETECTED Final   Staphylococcus species NOT DETECTED NOT DETECTED Final   Staphylococcus aureus (BCID) NOT DETECTED NOT  DETECTED Final   Streptococcus species NOT DETECTED NOT DETECTED Final   Streptococcus agalactiae NOT DETECTED NOT DETECTED Final   Streptococcus pneumoniae NOT DETECTED NOT DETECTED Final   Streptococcus pyogenes NOT DETECTED NOT DETECTED Final   Acinetobacter baumannii NOT DETECTED NOT DETECTED Final   Enterobacteriaceae species DETECTED (A) NOT DETECTED Final    Comment: Enterobacteriaceae represent a large family of gram-negative bacteria, not a single organism. CRITICAL RESULT CALLED TO, READ BACK BY AND VERIFIED WITH: T. GREEN,PHARMD [id number] 12/17/2018 T. TYSOR    Enterobacter cloacae complex NOT DETECTED NOT DETECTED Final   Escherichia coli DETECTED (A) NOT DETECTED Final    Comment: CRITICAL RESULT CALLED TO, READ BACK BY AND VERIFIED WITH: T. Lelon Mast 2542 12/17/2018 T. TYSOR    Klebsiella oxytoca NOT DETECTED NOT DETECTED Final   Klebsiella pneumoniae NOT DETECTED NOT DETECTED Final   Proteus species NOT DETECTED NOT DETECTED Final   Serratia marcescens NOT DETECTED NOT DETECTED Final   Carbapenem resistance NOT DETECTED NOT DETECTED Final   Haemophilus influenzae NOT DETECTED NOT DETECTED Final   Neisseria meningitidis NOT DETECTED NOT DETECTED Final   Pseudomonas aeruginosa NOT DETECTED NOT DETECTED Final   Candida albicans NOT DETECTED NOT DETECTED Final   Candida glabrata NOT DETECTED NOT DETECTED Final   Candida krusei NOT DETECTED NOT DETECTED Final   Candida parapsilosis NOT DETECTED NOT DETECTED Final   Candida tropicalis NOT DETECTED NOT DETECTED  Final    Comment: Performed at [facility], [address] [place], [place] [address]  Blood culture (routine x 2)     Status: None (Preliminary result)   Collection Time: 12/16/18  3:24 AM  Result Value Ref Range Status   Specimen Description   Final    BLOOD RIGHT ARM Performed at [facility], [address] [place], [place] [address]    Special Requests   Final    BOTTLES DRAWN AEROBIC AND ANAEROBIC Blood Culture adequate volume Performed at [facility], [address] [place], [place] [address]    Culture   Final    NO GROWTH 3 DAYS Performed at [facility], [address] [place], [place] [address]    Report Status PENDING  Incomplete  Fungus culture, blood     Status: None (Preliminary result)   Collection Time: 12/16/18  3:41 PM  Result Value Ref Range Status   Specimen Description   Final    BLOOD RIGHT HAND Performed at [facility], [address], [place], [place] [address]    Special Requests   Final    BOTTLES DRAWN AEROBIC AND ANAEROBIC Blood Culture adequate volume Performed at [facility], [address] [place], [place] [address]    Culture   Final    NO GROWTH 3 DAYS Performed at [facility], [address], [place], [place] [address]    Report Status PENDING  Incomplete  MRSA PCR Screening     Status: None   Collection Time: 12/16/18  3:52 PM  Result Value Ref Range Status   MRSA by PCR NEGATIVE NEGATIVE Final    Comment:        The GeneXpert MRSA Assay (FDA approved for NASAL specimens only), is one component of a comprehensive MRSA colonization surveillance program. It is not intended to diagnose MRSA infection nor to guide or monitor treatment for MRSA infections. Performed at [facility], [address] [place], [place]  [address]       Studies: No results found.  Scheduled Meds: . enoxaparin (LOVENOX) injection  40 mg  Subcutaneous Q24H  . methocarbamol  500 mg Oral BID  . [START ON 12/20/2018] nicotine  14 mg Transdermal Daily  . senna-docusate  2 tablet Oral BID    Continuous Infusions: . sodium chloride 75 mL/hr at 12/19/18 0043  . cefTRIAXone (ROCEPHIN)  IV 2 g (12/19/18 1208)     LOS: 3 days     Darlin Drop, MD [facility] Pager [phone]  If 7PM-7AM, please contact night-coverage [email or web address] Password [email or web address] 12/19/2018, 1:08 PM

## 2018-12-19 NOTE — Progress Notes (Signed)
INFECTIOUS DISEASE PROGRESS NOTE  ID: Hannah Bailey is a 33 y.o. female with  Active Problems:   Pyelonephritis   Bronchiolitis   Splenic abscess   IVDU (intravenous drug user)  Subjective: No abd pain.  Polyuria. Bladder fullness, dyscomfort.   Abtx:  Anti-infectives (From admission, onward)   Start     Dose/Rate Route Frequency Ordered Stop   12/17/18 1400  piperacillin-tazobactam (ZOSYN) IVPB 3.375 g  Status:  Discontinued     3.375 g 12.5 mL/hr over 240 Minutes Intravenous Every 8 hours 12/17/18 0548 12/17/18 0716   12/17/18 1300  metroNIDAZOLE (FLAGYL) IVPB 500 mg  Status:  Discontinued     500 mg 100 mL/hr over 60 Minutes Intravenous Every 8 hours 12/17/18 1052 12/17/18 1253   12/17/18 1200  cefTRIAXone (ROCEPHIN) 2 g in sodium chloride 0.9 % 100 mL IVPB     2 g 200 mL/hr over 30 Minutes Intravenous Every 24 hours 12/17/18 0716     12/17/18 0545  piperacillin-tazobactam (ZOSYN) IVPB 3.375 g     3.375 g 100 mL/hr over 30 Minutes Intravenous  Once 12/17/18 0541 12/17/18 0710   12/16/18 0430  vancomycin (VANCOCIN) IVPB 1000 mg/200 mL premix     1,000 mg 200 mL/hr over 60 Minutes Intravenous  Once 12/16/18 0429 12/16/18 0539   12/16/18 0300  cefTRIAXone (ROCEPHIN) 1 g in sodium chloride 0.9 % 100 mL IVPB     1 g 200 mL/hr over 30 Minutes Intravenous  Once 12/16/18 0259 12/16/18 0401      Medications:  Scheduled: . enoxaparin (LOVENOX) injection  40 mg Subcutaneous Q24H  . methocarbamol  500 mg Oral BID  . nicotine  14 mg Transdermal Daily  . senna-docusate  2 tablet Oral BID    Objective: Vital signs in last 24 hours: Temp:  [98 F (36.7 C)-98.8 F (37.1 C)] 98.2 F (36.8 C) (03/10 1255) Pulse Rate:  [73-93] 73 (03/10 1255) Resp:  [14-19] 19 (03/10 1255) BP: (111-127)/(84-87) 111/84 (03/10 1255) SpO2:  [97 %-100 %] 97 % (03/10 1255)   General appearance: alert, cooperative and no distress Resp: clear to auscultation bilaterally Cardio: regular rate  and rhythm GI: normal findings: bowel sounds normal and soft, non-tender  Lab Results Recent Labs    12/17/18 0421 12/18/18 0634 12/19/18 0447  WBC 12.8* 8.1  --   HGB 9.8* 9.9*  --   HCT 30.4* 31.4*  --   NA 137 139 139  K 3.2* 3.3* 3.5  CL 111 109 106  CO2 21* 24 26  BUN 8 <5* 5*  CREATININE 0.53 0.59 0.50   Liver Panel Recent Labs    12/17/18 0421  PROT 5.4*  ALBUMIN 2.6*  AST 14*  ALT 12  ALKPHOS 38  BILITOT 0.7   Sedimentation Rate No results for input(s): ESRSEDRATE in the last 72 hours. C-Reactive Protein No results for input(s): CRP in the last 72 hours.  Microbiology: Recent Results (from the past 240 hour(s))  Urine Culture     Status: Abnormal   Collection Time: 12/16/18  2:26 AM  Result Value Ref Range Status   Specimen Description   Final    URINE, RANDOM Performed at [facility], [address] [place], [place] [address]    Special Requests   Final    NONE Performed at [facility], [address] [place], [place] [address]    Culture >=100,000 COLONIES/mL ESCHERICHIA COLI (A)  Final   Report  Status 12/18/2018 FINAL  Final   Organism ID, Bacteria ESCHERICHIA COLI (A)  Final      Susceptibility   Escherichia coli - MIC*    AMPICILLIN >=32 RESISTANT Resistant     CEFAZOLIN <=4 SENSITIVE Sensitive     CEFTRIAXONE <=1 SENSITIVE Sensitive     CIPROFLOXACIN <=0.25 SENSITIVE Sensitive     GENTAMICIN <=1 SENSITIVE Sensitive     IMIPENEM <=0.25 SENSITIVE Sensitive     NITROFURANTOIN <=16 SENSITIVE Sensitive     TRIMETH/SULFA <=20 SENSITIVE Sensitive     AMPICILLIN/SULBACTAM >=32 RESISTANT Resistant     PIP/TAZO <=4 SENSITIVE Sensitive     Extended ESBL NEGATIVE Sensitive     * >=100,000 COLONIES/mL ESCHERICHIA COLI  Blood culture (routine x 2)     Status: Abnormal   Collection Time: 12/16/18  3:10 AM  Result Value Ref Range Status   Specimen Description   Final    BLOOD LEFT ARM Performed at [facility],  [address] [place], [place] [address]    Special Requests   Final    BOTTLES DRAWN AEROBIC AND ANAEROBIC Blood Culture adequate volume Performed at [facility], [address] [place], [place] [address]    Culture  Setup Time   Final    GRAM NEGATIVE RODS AEROBIC BOTTLE ONLY CRITICAL RESULT CALLED TO, READ BACK BY AND VERIFIED WITH: T. GREEN,PHARMD 12/17/2018 Girtha Hake Performed at [facility], [address], [place], [place] [address]    Culture ESCHERICHIA COLI (A)  Final   Report Status 12/19/2018 FINAL  Final   Organism ID, Bacteria ESCHERICHIA COLI  Final      Susceptibility   Escherichia coli - MIC*    AMPICILLIN >=32 RESISTANT Resistant     CEFAZOLIN <=4 SENSITIVE Sensitive     CEFEPIME <=1 SENSITIVE Sensitive     CEFTAZIDIME <=1 SENSITIVE Sensitive     CEFTRIAXONE <=1 SENSITIVE Sensitive     CIPROFLOXACIN <=0.25 SENSITIVE Sensitive     GENTAMICIN <=1 SENSITIVE Sensitive     IMIPENEM <=0.25 SENSITIVE Sensitive     TRIMETH/SULFA <=20 SENSITIVE Sensitive     AMPICILLIN/SULBACTAM >=32 RESISTANT Resistant     PIP/TAZO <=4 SENSITIVE Sensitive     Extended ESBL NEGATIVE Sensitive     * ESCHERICHIA COLI  Blood Culture ID Panel (Reflexed)     Status: Abnormal   Collection Time: 12/16/18  3:10 AM  Result Value Ref Range Status   Enterococcus species NOT DETECTED NOT DETECTED Final   Listeria monocytogenes NOT DETECTED NOT DETECTED Final   Staphylococcus species NOT DETECTED NOT DETECTED Final   Staphylococcus aureus (BCID) NOT DETECTED NOT DETECTED Final   Streptococcus species NOT DETECTED NOT DETECTED Final   Streptococcus agalactiae NOT DETECTED NOT DETECTED Final   Streptococcus pneumoniae NOT DETECTED NOT DETECTED Final   Streptococcus pyogenes NOT DETECTED NOT DETECTED Final   Acinetobacter baumannii NOT DETECTED NOT DETECTED Final   Enterobacteriaceae species DETECTED (A) NOT DETECTED Final    Comment: Enterobacteriaceae represent a large  family of gram-negative bacteria, not a single organism. CRITICAL RESULT CALLED TO, READ BACK BY AND VERIFIED WITH: T. GREEN,PHARMD [id number] 12/17/2018 T. TYSOR    Enterobacter cloacae complex NOT DETECTED NOT DETECTED Final   Escherichia coli DETECTED (A) NOT DETECTED Final    Comment: CRITICAL RESULT CALLED TO, READ BACK BY AND VERIFIED WITH: T. GREEN,PHARMD [id number] 12/17/2018 T. TYSOR    Klebsiella oxytoca NOT DETECTED NOT DETECTED Final  Klebsiella pneumoniae NOT DETECTED NOT DETECTED Final   Proteus species NOT DETECTED NOT DETECTED Final   Serratia marcescens NOT DETECTED NOT DETECTED Final   Carbapenem resistance NOT DETECTED NOT DETECTED Final   Haemophilus influenzae NOT DETECTED NOT DETECTED Final   Neisseria meningitidis NOT DETECTED NOT DETECTED Final   Pseudomonas aeruginosa NOT DETECTED NOT DETECTED Final   Candida albicans NOT DETECTED NOT DETECTED Final   Candida glabrata NOT DETECTED NOT DETECTED Final   Candida krusei NOT DETECTED NOT DETECTED Final   Candida parapsilosis NOT DETECTED NOT DETECTED Final   Candida tropicalis NOT DETECTED NOT DETECTED Final    Comment: Performed at [facility], [address] [place], [place] [address]  Blood culture (routine x 2)     Status: None (Preliminary result)   Collection Time: 12/16/18  3:24 AM  Result Value Ref Range Status   Specimen Description   Final    BLOOD RIGHT ARM Performed at [facility], [address] [place], [place] [address]    Special Requests   Final    BOTTLES DRAWN AEROBIC AND ANAEROBIC Blood Culture adequate volume Performed at [facility], [address] [place], [place] [address]    Culture   Final    NO GROWTH 3 DAYS Performed at [facility], [address] [place], [place] [address]    Report Status PENDING  Incomplete  Fungus culture, blood     Status: None (Preliminary result)   Collection Time: 12/16/18  3:41 PM  Result Value Ref Range Status   Specimen  Description   Final    BLOOD RIGHT HAND Performed at [facility], [address] [place] [address], [place], [place] [address]    Special Requests   Final    BOTTLES DRAWN AEROBIC AND ANAEROBIC Blood Culture adequate volume Performed at [facility], [address], [place], [place] [address]    Culture   Final    NO GROWTH 3 DAYS Performed at [facility], [address], [place], [place] [address]    Report Status PENDING  Incomplete  MRSA PCR Screening     Status: None   Collection Time: 12/16/18  3:52 PM  Result Value Ref Range Status   MRSA by PCR NEGATIVE NEGATIVE Final    Comment:        The GeneXpert MRSA Assay (FDA approved for NASAL specimens only), is one component of a comprehensive MRSA colonization surveillance program. It is not intended to diagnose MRSA infection nor to guide or monitor treatment for MRSA infections. Performed at [facility], [address] [place], [place] [address]     Studies/Results: No results found.   Assessment/Plan: Splenic abscesses Pyelo E coli bacteremia, UTI Nonobstructing left nephrolithiasis. Remote IVDU  Total days of antibiotics: 5 (ceftriaxone)  HIV and Hep C (-) Repeat BCx sent 3-10. Repeat imaging 3-12 D/c planning pending repeat CT         Johny Sax MD, FACP Infectious Diseases (pager) [phone] [email or web address] 12/19/2018, 3:42 PM  LOS: 3 days

## 2018-12-20 DIAGNOSIS — E041 Nontoxic single thyroid nodule: Secondary | ICD-10-CM

## 2018-12-20 LAB — CBC WITH DIFFERENTIAL/PLATELET
Abs Immature Granulocytes: 0.02 10*3/uL (ref 0.00–0.07)
Basophils Absolute: 0 10*3/uL (ref 0.0–0.1)
Basophils Relative: 0 %
Eosinophils Absolute: 0.1 10*3/uL (ref 0.0–0.5)
Eosinophils Relative: 2 %
HCT: 32.7 % — ABNORMAL LOW (ref 36.0–46.0)
Hemoglobin: 10.9 g/dL — ABNORMAL LOW (ref 12.0–15.0)
Immature Granulocytes: 0 %
Lymphocytes Relative: 27 %
Lymphs Abs: 1.6 10*3/uL (ref 0.7–4.0)
MCH: 29.8 pg (ref 26.0–34.0)
MCHC: 33.3 g/dL (ref 30.0–36.0)
MCV: 89.3 fL (ref 80.0–100.0)
Monocytes Absolute: 0.7 10*3/uL (ref 0.1–1.0)
Monocytes Relative: 11 %
NRBC: 0 % (ref 0.0–0.2)
Neutro Abs: 3.5 10*3/uL (ref 1.7–7.7)
Neutrophils Relative %: 60 %
Platelets: 250 10*3/uL (ref 150–400)
RBC: 3.66 MIL/uL — ABNORMAL LOW (ref 3.87–5.11)
RDW: 12.4 % (ref 11.5–15.5)
WBC: 5.9 10*3/uL (ref 4.0–10.5)

## 2018-12-20 LAB — BASIC METABOLIC PANEL
Anion gap: 11 (ref 5–15)
BUN: 11 mg/dL (ref 6–20)
CO2: 26 mmol/L (ref 22–32)
Calcium: 9.1 mg/dL (ref 8.9–10.3)
Chloride: 102 mmol/L (ref 98–111)
Creatinine, Ser: 0.49 mg/dL (ref 0.44–1.00)
GFR calc Af Amer: >60 mL/min (ref >60)
GFR calc non Af Amer: >60 mL/min (ref >60)
Glucose, Bld: 104 mg/dL — ABNORMAL HIGH (ref 70–99)
POTASSIUM: 4 mmol/L (ref 3.5–5.1)
Sodium: 139 mmol/L (ref 135–145)

## 2018-12-20 LAB — MAGNESIUM: Magnesium: 1.6 mg/dL — ABNORMAL LOW (ref 1.7–2.4)

## 2018-12-20 MED ORDER — MAGNESIUM SULFATE 2 GM/50ML IV SOLN
2.0000 g | Freq: Once | INTRAVENOUS | Status: AC
  Administered 2018-12-20: 2 g via INTRAVENOUS
  Filled 2018-12-20: qty 50

## 2018-12-20 NOTE — Progress Notes (Addendum)
PROGRESS NOTE    Hannah Bailey  WIO:[id number] DOB: [birth year] DOA: 12/16/2018 PCP: Patient, No Pcp Per   Brief Narrative: 33 yr old woman with past medical history significant for IV drug use, polysubstance abuse including methamphetamine and THC who presented to [facility] ED with complaints of dysuria of a few days duration.  Associated with chills and night sweats.  Upon arrival at [facility], vital signs unremarkable, WBC 18.2K.  Positive UA for pyuria.  CT abdomen and pelvis done on 12/16/2018 demonstrted cystitis and left pyelonephritis. No drainable abscess was identified. There was non-obstructing left nephrolithiasis. There were also innumerable subcentimeter low density lesions throughout the spleen. These could be micro-abscesses, including fungal. There are tree in bud opacities in the right lower lobe suspicious for bronchiolitis.  TRH asked to admit.  Hospital course complicated by E. coli bacteremia and splenic abscesses.  Curb sided with Dr. Ezzard Standing on 12/17/2018 who independently reviewed imaging and recommended conservative management with IV antibiotics.  To repeat imaging in the week, if persistent to reconsult general surgery.  Infectious disease consulted and is following.   Assessment & Plan:   Active Problems:   Pyelonephritis of left kidney   Bronchiolitis   Splenic abscess   IVDU (intravenous drug user)  Sepsis secondary to acute left pyelonephritis with left nonobstructive nephrolithiasis, E. coli bacteremia and UTI, splenic abscesses, right lower lobe bronchiolitis/E. coli bacteremia suspect secondary to E. coli UTI and left pyelonephritis- Patient presented with dysuria, nausea, vomiting, left flank and abdominal pain Leukocytosis, tachycardia and tachypnea CT abdomen and pelvis with contrast on admission independently reviewed revealed signs of left pyelonephritis, splenic abscess, left nonobstructing nephrolithiasis, and right lower lobe possible bronchiolitis Blood  cultures 1 out of 2 bottles positive for E. coli resistant to ampicillin and ampicillin sulbactam On Ancef starting this morning 2 g IV every 8.  Was on Rocephin prior to this. HIV/hepatitis C/MRSA negative  Repeat blood cultures x2 peripherally done on 12/19/2018 no growth fungal culture no growth Afebrile with no leukocytosis in latest CBC Obtain CBC in the morning CT scan of the abdomen and pelvis 12/21/2018 to follow-up on the abscess   hypokalemia repleted  Hypomagnesemia replete   Right lower lobe bronchiolitis Asymptomatic Continue IV antibiotics for bacteremia O2 sat stable on room air  Polysubstance abuse/history of IV drug abuser UDS done on 12/16/2018+ for amphetamine and THC  Tobacco use disorder Tobacco cessation counseling Nicotine patch 14 mg patch daily   DVT prophylaxis with Lovenox  Code Status: Full code  Family Communication: None at bedside  Disposition Plan: Pending CT scan report 12/21/2018   Estimated body mass index is 21.63 kg/m as calculated from the following:   Height as of this encounter: 5\' 5"  (1.651 m).   Weight as of this encounter: 59 kg.   Consultants:   Infectious disease  Procedures: None Antimicrobials Ancef  Subjective: Ambulating in the room going from bed to the restroom without any pain or complaints  Objective: Vitals:   12/18/18 2059 12/19/18 0502 12/19/18 1255 12/20/18 0504  BP: 123/87 127/86 111/84 108/84  Pulse: 93 90 73 82  Resp: 14 14 19 12   Temp: 98.8 F (37.1 C) 98 F (36.7 C) 98.2 F (36.8 C) 98.2 F (36.8 C)  TempSrc: Oral Oral Oral Oral  SpO2: 100% 100% 97% 97%  Weight:      Height:        Intake/Output Summary (Last 24 hours) at 12/20/2018 3845 Last data filed at 12/20/2018 0600 Gross per  24 hour  Intake 2745.55 ml  Output 2000 ml  Net 745.55 ml   Filed Weights   12/16/18 0213  Weight: 59 kg    Examination:  General exam: Appears calm and comfortable  Respiratory system:  Clear to auscultation. Respiratory effort normal. Cardiovascular system: S1 & S2 heard, RRR. No JVD, murmurs, rubs, gallops or clicks. No pedal edema. Gastrointestinal system: Abdomen is nondistended, soft and nontender. No organomegaly or masses felt. Normal bowel sounds heard.  Mild left CVA tenderness Central nervous system: Alert and oriented. No focal neurological deficits. Extremities: Symmetric 5 x 5 power. Skin: No rashes, lesions or ulcers Psychiatry: Judgement and insight appear normal. Mood & affect appropriate.     Data Reviewed: I have personally reviewed following labs and imaging studies  CBC: Recent Labs  Lab 12/16/18 0226 12/16/18 1541 12/17/18 0421 12/18/18 0634 12/20/18 0419  WBC 24.5* 18.2* 12.8* 8.1 5.9  NEUTROABS  --   --   --   --  3.5  HGB 12.8 10.9* 9.8* 9.9* 10.9*  HCT 38.4 33.5* 30.4* 31.4* 32.7*  MCV 90.4 94.4 94.4 96.0 89.3  PLT 185 145* 127* 154 250   Basic Metabolic Panel: Recent Labs  Lab 12/16/18 0226 12/16/18 1541 12/17/18 0421 12/18/18 0634 12/19/18 0447 12/20/18 0419  NA 130*  --  137 139 139 139  K 3.5  --  3.2* 3.3* 3.5 4.0  CL 96*  --  111 109 106 102  CO2 24  --  21* GLUCOSE 182*  --  116* 95 91 104*  BUN 15  --  8 <5* 5* 11  CREATININE 0.88 0.51 0.53 0.59 0.50 0.49  CALCIUM 8.9  --  7.5* 8.1* 8.2* 9.1  MG  --   --   --  1.6*  --  1.6*   GFR: Estimated Creatinine Clearance: 92.5 mL/min (by C-G formula based on SCr of 0.49 mg/dL). Liver Function Tests: Recent Labs  Lab 12/16/18 0226 12/17/18 0421  AST 21 14*  ALT 13 12  ALKPHOS 45 38  BILITOT 1.2 0.7  PROT 7.6 5.4*  ALBUMIN 3.8 2.6*   No results for input(s): LIPASE, AMYLASE in the last 168 hours. No results for input(s): AMMONIA in the last 168 hours. Coagulation Profile: No results for input(s): INR, PROTIME in the last 168 hours. Cardiac Enzymes: No results for input(s): CKTOTAL, CKMB, CKMBINDEX, TROPONINI in the last 168 hours. BNP (last 3 results)  No results for input(s): PROBNP in the last 8760 hours. HbA1C: No results for input(s): HGBA1C in the last 72 hours. CBG: No results for input(s): GLUCAP in the last 168 hours. Lipid Profile: No results for input(s): CHOL, HDL, LDLCALC, TRIG, CHOLHDL, LDLDIRECT in the last 72 hours. Thyroid Function Tests: No results for input(s): TSH, T4TOTAL, FREET4, T3FREE, THYROIDAB in the last 72 hours. Anemia Panel: No results for input(s): VITAMINB12, FOLATE, FERRITIN, TIBC, IRON, RETICCTPCT in the last 72 hours. Sepsis Labs: Recent Labs  Lab 12/16/18 0226 12/16/18 0605  LATICACIDVEN 2.8* 0.9    Recent Results (from the past 240 hour(s))  Urine Culture     Status: Abnormal   Collection Time: 12/16/18  2:26 AM  Result Value Ref Range Status   Specimen Description   Final    URINE, RANDOM Performed at [facility], [address] [place], [place] [address]    Special Requests   Final    NONE Performed at [facility], [address]  [place], [place] [address]    Culture >=100,000 COLONIES/mL ESCHERICHIA COLI (A)  Final   Report Status 12/18/2018 FINAL  Final   Organism ID, Bacteria ESCHERICHIA COLI (A)  Final      Susceptibility   Escherichia coli - MIC*    AMPICILLIN >=32 RESISTANT Resistant     CEFAZOLIN <=4 SENSITIVE Sensitive     CEFTRIAXONE <=1 SENSITIVE Sensitive     CIPROFLOXACIN <=0.25 SENSITIVE Sensitive     GENTAMICIN <=1 SENSITIVE Sensitive     IMIPENEM <=0.25 SENSITIVE Sensitive     NITROFURANTOIN <=16 SENSITIVE Sensitive     TRIMETH/SULFA <=20 SENSITIVE Sensitive     AMPICILLIN/SULBACTAM >=32 RESISTANT Resistant     PIP/TAZO <=4 SENSITIVE Sensitive     Extended ESBL NEGATIVE Sensitive     * >=100,000 COLONIES/mL ESCHERICHIA COLI  Blood culture (routine x 2)     Status: Abnormal   Collection Time: 12/16/18  3:10 AM  Result Value Ref Range Status   Specimen Description   Final    BLOOD LEFT ARM Performed at [facility], [address] [place], [place] [address]    Special Requests   Final    BOTTLES DRAWN AEROBIC AND ANAEROBIC Blood Culture adequate volume Performed at [facility], [address] [place], [place] [address]    Culture  Setup Time   Final    GRAM NEGATIVE RODS AEROBIC BOTTLE ONLY CRITICAL RESULT CALLED TO, READ BACK BY AND VERIFIED WITH: T. GREEN,PHARMD 12/17/2018 Girtha Hake Performed at [facility], [address] [place], [place] [address]    Culture ESCHERICHIA COLI (A)  Final   Report Status 12/19/2018 FINAL  Final   Organism ID, Bacteria ESCHERICHIA COLI  Final      Susceptibility   Escherichia coli - MIC*    AMPICILLIN >=32 RESISTANT Resistant     CEFAZOLIN <=4 SENSITIVE Sensitive     CEFEPIME <=1 SENSITIVE Sensitive     CEFTAZIDIME <=1 SENSITIVE Sensitive     CEFTRIAXONE <=1 SENSITIVE Sensitive     CIPROFLOXACIN <=0.25 SENSITIVE Sensitive     GENTAMICIN <=1 SENSITIVE Sensitive     IMIPENEM <=0.25 SENSITIVE Sensitive     TRIMETH/SULFA <=20 SENSITIVE Sensitive     AMPICILLIN/SULBACTAM >=32 RESISTANT Resistant     PIP/TAZO <=4 SENSITIVE Sensitive     Extended ESBL NEGATIVE Sensitive     * ESCHERICHIA COLI  Blood Culture ID Panel (Reflexed)     Status: Abnormal   Collection Time: 12/16/18  3:10 AM  Result Value Ref Range Status   Enterococcus species NOT DETECTED NOT DETECTED Final   Listeria monocytogenes NOT DETECTED NOT DETECTED Final   Staphylococcus species NOT DETECTED NOT DETECTED Final   Staphylococcus aureus (BCID) NOT DETECTED NOT DETECTED Final   Streptococcus species NOT DETECTED NOT DETECTED Final   Streptococcus agalactiae NOT DETECTED NOT DETECTED Final   Streptococcus pneumoniae NOT DETECTED NOT DETECTED Final   Streptococcus pyogenes NOT DETECTED NOT DETECTED Final   Acinetobacter baumannii NOT DETECTED NOT DETECTED Final   Enterobacteriaceae species DETECTED (A) NOT DETECTED Final    Comment: Enterobacteriaceae represent a large family  of gram-negative bacteria, not a single organism. CRITICAL RESULT CALLED TO, READ BACK BY AND VERIFIED WITH: T. GREEN,PHARMD 2956 12/17/2018 T. TYSOR    Enterobacter cloacae complex NOT DETECTED NOT DETECTED Final   Escherichia coli DETECTED (A) NOT DETECTED Final    Comment: CRITICAL RESULT CALLED TO, READ BACK BY AND VERIFIED WITH:  T. GREEN,PHARMD 0981 12/17/2018 T. TYSOR    Klebsiella oxytoca NOT DETECTED NOT DETECTED Final   Klebsiella pneumoniae NOT DETECTED NOT DETECTED Final   Proteus species NOT DETECTED NOT DETECTED Final   Serratia marcescens NOT DETECTED NOT DETECTED Final   Carbapenem resistance NOT DETECTED NOT DETECTED Final   Haemophilus influenzae NOT DETECTED NOT DETECTED Final   Neisseria meningitidis NOT DETECTED NOT DETECTED Final   Pseudomonas aeruginosa NOT DETECTED NOT DETECTED Final   Candida albicans NOT DETECTED NOT DETECTED Final   Candida glabrata NOT DETECTED NOT DETECTED Final   Candida krusei NOT DETECTED NOT DETECTED Final   Candida parapsilosis NOT DETECTED NOT DETECTED Final   Candida tropicalis NOT DETECTED NOT DETECTED Final    Comment: Performed at [facility], [address] [place], [place] [address]  Blood culture (routine x 2)     Status: None (Preliminary result)   Collection Time: 12/16/18  3:24 AM  Result Value Ref Range Status   Specimen Description   Final    BLOOD RIGHT ARM Performed at [facility], [address] [place], [place] [address]    Special Requests   Final    BOTTLES DRAWN AEROBIC AND ANAEROBIC Blood Culture adequate volume Performed at [facility], [address] [place], [place] [address]    Culture   Final    NO GROWTH 4 DAYS Performed at [facility], [address], [place], [place] [address]    Report Status PENDING  Incomplete  Fungus culture, blood     Status: None (Preliminary result)   Collection Time: 12/16/18  3:41 PM  Result Value Ref Range Status   Specimen  Description   Final    BLOOD RIGHT HAND Performed at [facility], [address] [place], [place] [address]    Special Requests   Final    BOTTLES DRAWN AEROBIC AND ANAEROBIC Blood Culture adequate volume Performed at [facility], [address] [place], [place] [address]    Culture   Final    NO GROWTH 4 DAYS Performed at [facility], [address] [place], [place] [address]    Report Status PENDING  Incomplete  MRSA PCR Screening     Status: None   Collection Time: 12/16/18  3:52 PM  Result Value Ref Range Status   MRSA by PCR NEGATIVE NEGATIVE Final    Comment:        The GeneXpert MRSA Assay (FDA approved for NASAL specimens only), is one component of a comprehensive MRSA colonization surveillance program. It is not intended to diagnose MRSA infection nor to guide or monitor treatment for MRSA infections. Performed at [facility], [address], [place], [place] [address]   Culture, blood (routine x 2)     Status: None (Preliminary result)   Collection Time: 12/19/18  4:47 AM  Result Value Ref Range Status   Specimen Description   Final    BLOOD LEFT ARM Performed at [facility], [address], [place], [place] [address]    Special Requests   Final    BOTTLES DRAWN AEROBIC AND ANAEROBIC Blood Culture adequate volume Performed at [facility], [address], [place], [place] [address]    Culture   Final    NO GROWTH < 24 HOURS Performed at [facility], [address], [place], [place] [address]    Report Status  PENDING  Incomplete  Culture, blood (routine x 2)     Status: None (Preliminary result)   Collection Time: 12/19/18  4:47 AM  Result Value Ref Range Status   Specimen Description   Final    BLOOD RIGHT HAND Performed at [facility], [address], [place], [place] [address]    Special Requests   Final    BOTTLES  DRAWN AEROBIC ONLY Blood Culture adequate volume Performed at [facility], [address] [place], [place] [address]    Culture   Final    NO GROWTH < 24 HOURS Performed at [facility], [address], [place], [place] [address]    Report Status PENDING  Incomplete         Radiology Studies: No results found.      Scheduled Meds: . enoxaparin (LOVENOX) injection  40 mg Subcutaneous Q24H  . methocarbamol  500 mg Oral BID  . nicotine  14 mg Transdermal Daily  . senna-docusate  2 tablet Oral BID   Continuous Infusions: . sodium chloride 50 mL/hr at 12/20/18 0600  .  ceFAZolin (ANCEF) IV Stopped (12/20/18 0537)     LOS: 4 days     Alwyn Ren, MD [facility]  If 7PM-7AM, please contact night-coverage [email or web address] Password [facility] 12/20/2018, 9:09 AM

## 2018-12-20 NOTE — Progress Notes (Addendum)
Pt received 2nd dose of Ancef and states she became nauseated after each dose. Pt given Zofran and states relief. Will pass on to next RN to premed prior to given antibiotic. SRP,RN

## 2018-12-21 ENCOUNTER — Inpatient Hospital Stay (HOSPITAL_COMMUNITY): Payer: Self-pay

## 2018-12-21 LAB — BASIC METABOLIC PANEL
Anion gap: 8 (ref 5–15)
BUN: 11 mg/dL (ref 6–20)
CO2: 30 mmol/L (ref 22–32)
Calcium: 9.3 mg/dL (ref 8.9–10.3)
Chloride: 102 mmol/L (ref 98–111)
Creatinine, Ser: 0.61 mg/dL (ref 0.44–1.00)
GFR calc Af Amer: >60 mL/min (ref >60)
GFR calc non Af Amer: >60 mL/min (ref >60)
Glucose, Bld: 122 mg/dL — ABNORMAL HIGH (ref 70–99)
Potassium: 3.5 mmol/L (ref 3.5–5.1)
Sodium: 140 mmol/L (ref 135–145)

## 2018-12-21 LAB — CBC
HEMATOCRIT: 34.4 % — AB (ref 36.0–46.0)
Hemoglobin: 11.2 g/dL — ABNORMAL LOW (ref 12.0–15.0)
MCH: 29.5 pg (ref 26.0–34.0)
MCHC: 32.6 g/dL (ref 30.0–36.0)
MCV: 90.5 fL (ref 80.0–100.0)
Platelets: 297 10*3/uL (ref 150–400)
RBC: 3.8 MIL/uL — ABNORMAL LOW (ref 3.87–5.11)
RDW: 12.3 % (ref 11.5–15.5)
WBC: 7.1 10*3/uL (ref 4.0–10.5)
nRBC: 0 % (ref 0.0–0.2)

## 2018-12-21 LAB — CULTURE, BLOOD (ROUTINE X 2)
Culture: NO GROWTH
Special Requests: ADEQUATE

## 2018-12-21 MED ORDER — IOHEXOL 300 MG/ML  SOLN
100.0000 mL | Freq: Once | INTRAMUSCULAR | Status: AC | PRN
  Administered 2018-12-21: 100 mL via INTRAVENOUS

## 2018-12-21 MED ORDER — SODIUM CHLORIDE (PF) 0.9 % IJ SOLN
INTRAMUSCULAR | Status: AC
  Filled 2018-12-21: qty 50

## 2018-12-21 MED ORDER — LEVOFLOXACIN 750 MG PO TABS: 750.0000 mg | ORAL_TABLET | Freq: Every day | ORAL | 0 refills | Status: AC

## 2018-12-21 MED ORDER — ACETAMINOPHEN 325 MG PO TABS: 650.0000 mg | ORAL_TABLET | Freq: Four times a day (QID) | ORAL | Status: AC | PRN

## 2018-12-21 MED ORDER — IOHEXOL 300 MG/ML  SOLN
15.0000 mL | Freq: Once | INTRAMUSCULAR | Status: DC | PRN
  Administered 2018-12-21: 15 mL via ORAL
  Filled 2018-12-21: qty 20

## 2018-12-21 NOTE — Discharge Summary (Signed)
Physician Discharge Summary  Hannah Bailey ZOX:[id number] DOB: Jun 04, 1986 DOA: 12/16/2018  PCP: Patient, No Pcp Per  Admit date: 12/16/2018 Discharge date: 12/21/2018  Admitted From home Disposition: Home Recommendations for Outpatient Follow-up:  1. Follow up with PCP in 1-2 weeks 2. Please obtain BMP/CBC in one week   Home Health none Equipment/Devices none  discharge Condition: Stable  CODE STATUS: Full code Diet recommendation cardiac   brief/Interim Summary:33 yr old woman with past medical history significant for IV drug use, polysubstance abuse including methamphetamine and THC who [facility] with complaints of dysuria of a few days duration. Associated with chills and night sweats.  [facility], vital signs unremarkable, WBC 18.2K. Positive UA for pyuria.  CT abdomen and pelvis done on 12/16/2018 demonstrted cystitis and left pyelonephritis. No drainable abscess was identified. There was non-obstructing left nephrolithiasis. There were also innumerable subcentimeter low density lesions throughout the spleen. These could be micro-abscesses, including fungal. There are tree in bud opacities in the right lower lobe suspicious for bronchiolitis. TRH asked to admit.  Hospital course complicated by E. coli bacteremia and splenic abscesses. Curb sided with Dr. Ezzard StandingNewman on 12/17/2018 who independently reviewed imaging and recommended conservative management with IV antibiotics. To repeat imaging in the week, if persistent to reconsult general surgery. Infectious disease consulted and is following.   Discharge Diagnoses:  Active Problems:   Pyelonephritis of left kidney   Bronchiolitis   Splenic abscess   IVDU (intravenous drug user)   Thyroid nodule   Sepsis secondary to acute left pyelonephritis with left nonobstructive nephrolithiasis, E. coli bacteremiaand UTI, splenic abscesses, right lower lobe bronchiolitis/E. coli bacteremia suspect secondary to E. coli  UTI and left pyelonephritis- Patient presented with dysuria, nausea, vomiting, left flank and abdominal pain Leukocytosis, tachycardia and tachypnea CT abdomen and pelvis with contrast on admission independently reviewed revealed signs of left pyelonephritis, splenic abscess, left nonobstructing nephrolithiasis, and right lower lobe possible bronchiolitis Blood cultures 1 out of 2 bottles positive for E. coliresistant to ampicillin and ampicillin sulbactam On Ancef starting this morning 2 g IV every 8.  Was on Rocephin prior to this. HIV/hepatitis C/MRSA negative  Repeat blood cultures x2 peripherally done on 12/19/2018 no growth fungal culture no growth Afebrile with no leukocytosisinlatest CBC CT scan of the abdomen and pelvis 12/21/2018 to follow-up on the abscess right kidney hypodensity consistent with pyelonephritis without evidence of discrete fluid collection to suggest abscess, numerous hypodense subcentimeter lesions of the spleen unchanged from prior.  Patient was seen by infectious disease recommended discharging her on levofloxacin for 2 weeks.   hypokalemia repleted  Hypomagnesemia repleted   Polysubstance abuse/history of IV drug abuser UDS done on 12/16/2018+ for amphetamine and THC  Tobacco use disorder Tobacco cessation counseling Nicotine patch 14mg  patch daily    Estimated body mass index is 21.63 kg/m as calculated from the following:   Height as of this encounter: 5\' 5"  (1.651 m).   Weight as of this encounter: 59 kg.  Discharge Instructions  Discharge Instructions    Call MD for:  persistant nausea and vomiting   Complete by:  As directed    Call MD for:  redness, tenderness, or signs of infection (pain, swelling, redness, odor or green/yellow discharge around incision site)   Complete by:  As directed    Call MD for:  temperature >100.4   Complete by:  As directed    Diet - low sodium heart healthy   Complete by:  As  directed    Increase  activity slowly   Complete by:  As directed      Allergies as of 12/21/2018   No Known Allergies     Medication List    STOP taking these medications   ibuprofen 200 MG tablet Commonly known as:  ADVIL,MOTRIN   ibuprofen 800 MG tablet Commonly known as:  ADVIL,MOTRIN     TAKE these medications   acetaminophen 325 MG tablet Commonly known as:  TYLENOL Take 2 tablets (650 mg total) by mouth every 6 (six) hours as needed for mild pain (or Fever >/= 101).   levofloxacin 750 MG tablet Commonly known as:  Levaquin Take 1 tablet (750 mg total) by mouth daily for 14 days.   methocarbamol 500 MG tablet Commonly known as:  ROBAXIN Take 1 tablet (500 mg total) by mouth 2 (two) times daily.   MULTIVITAMIN ADULT PO Take 1 tablet by mouth daily.      Follow-up Information    [facility] Follow up.   Contact information: [address] [place] [address] [phone]         No Known Allergies  Consultations:  id   Procedures/Studies: Ct Abdomen Pelvis W Contrast  Result Date: 12/21/2018 CLINICAL DATA:  Abdominal infection, sepsis, splenic abscess EXAM: CT ABDOMEN AND PELVIS WITH CONTRAST TECHNIQUE: Multidetector CT imaging of the abdomen and pelvis was performed using the standard protocol following bolus administration of intravenous contrast. CONTRAST:  OMNIPAQUE IOHEXOL 300 MG/ML  SOLN COMPARISON:  12/16/2018 FINDINGS: Lower chest: No change in clustered centrilobular nodularity of the right lung base, partially imaged (series 4, image 1). Hepatobiliary: No focal liver abnormality is seen. No gallstones, gallbladder wall thickening, or biliary dilatation. Pancreas: Unremarkable. No pancreatic ductal dilatation or surrounding inflammatory changes. Spleen: Numerous hypodense subcentimeter lesions of the spleen, unchanged from prior. Adrenals/Urinary Tract: Adrenal glands are unremarkable. Redemonstrated hypodensity  at the medial upper pole of the right kidney, consistent with pyelonephritis but without evidence of discrete fluid collections. Bladder is unremarkable. Stomach/Bowel: Stomach is within normal limits. Appendix appears normal. No evidence of bowel wall thickening, distention, or inflammatory changes. Large burden of stool in the colon. Vascular/Lymphatic: No significant vascular findings are present. No enlarged abdominal or pelvic lymph nodes. Reproductive: No mass or other abnormality. Other: No abdominal wall hernia or abnormality. No abdominopelvic ascites. Musculoskeletal: No acute or significant osseous findings. IMPRESSION: 1.  Generally unchanged examination of the abdomen and pelvis. 2. Redemonstrated hypodensity at the medial upper pole of the right kidney, consistent with pyelonephritis but without evidence of discrete fluid collection to suggest abscess. 3. Numerous hypodense subcentimeter lesions of the spleen, unchanged from prior. As on prior examination, these are suspicious for infectious etiology, including hematogenous fungal infection. Electronically Signed   By: Lauralyn Primes M.D.   On: 12/21/2018 14:23   Ct Abdomen Pelvis W Contrast  Result Date: 12/16/2018 CLINICAL DATA:  Nausea, vomiting left sided abdominal pain, left flank pain, fever EXAM: CT ABDOMEN AND PELVIS WITH CONTRAST TECHNIQUE: Multidetector CT imaging of the abdomen and pelvis was performed using the standard protocol following bolus administration of intravenous contrast. CONTRAST:  OMNIPAQUE IOHEXOL 300 MG/ML  SOLN COMPARISON:  None. FINDINGS: Lower chest: Faint reticular/tree in bud opacities in the right lower lobe. No pleural fluid. Hepatobiliary: 3 mm hypodensity in the left in the liver is too small to characterize. No suspicious hepatic lesion. Gallbladder physiologically distended, no calcified stone. No biliary dilatation. Pancreas:  No ductal dilatation or inflammation. Spleen: Heterogeneous parenchyma with  innumerable subcentimeter low-density lesions. Overall normal in size. Adrenals/Urinary Tract: Normal adrenal glands. Heterogeneous decreased density within the upper medial and anterior mid left kidney consistent with pyelonephritis. No drainable renal abscess. Two small nonobstructing stones in the left kidney. No left hydronephrosis. There is left ureteral thickening and enhancement. Homogeneous enhancement of the right kidney without right hydronephrosis. Diffuse bladder wall thickening with perivesicular edema. Stomach/Bowel: Stomach is within normal limits. Appendix appears normal. No evidence of bowel wall thickening, distention, or inflammatory changes. Moderate colonic stool burden throughout the entire colon. Vascular/Lymphatic: Normal caliber abdominal aorta. Portal and splenic veins are patent. No acute vascular abnormality. No enlarged lymph nodes in the abdomen or pelvis. Reproductive: Retroverted uterus. No adnexal mass. Other: Small amount of complex fluid in the pelvis may be reactive or physiologic. Generalized stranding of the pelvic fat. No free air or intra-abdominal abscess. Small fat containing umbilical hernia. Musculoskeletal: There are no acute or suspicious osseous abnormalities. IMPRESSION: 1. Cystitis and left pyelonephritis. No drainable renal abscess. 2. Nonobstructing left nephrolithiasis. 3. Innumerable subcentimeter low-density lesions throughout the spleen. This may be infectious and represent micro abscesses, including fungal. This does not have the appearance of perfusion abnormalities. 4. Tree in bud opacities in the right lower lobe suspicious for bronchiolitis. Electronically Signed   By: Narda Rutherford M.D.   On: 12/16/2018 03:48    (Echo, Carotid, EGD, Colonoscopy, ERCP)    Subjective:   Discharge Exam: Vitals:   12/21/18 1100 12/21/18 1307  BP: 112/80 (!) 116/57  Pulse: 79 86  Resp: 18 16  Temp: 97.8 F (36.6 C) 98.6 F (37 C)  SpO2:  99%   Vitals:    12/21/18 0701 12/21/18 0800 12/21/18 1100 12/21/18 1307  BP: 99/63 109/70 112/80 (!) 116/57  Pulse: 72 77 79 86  Resp: 16 20 18 16   Temp: 98.1 F (36.7 C) 98.2 F (36.8 C) 97.8 F (36.6 C) 98.6 F (37 C)  TempSrc: Oral Oral Oral Oral  SpO2: 98% 98%  99%  Weight:      Height:        General: Pt is alert, awake, not in acute distress Cardiovascular: RRR, S1/S2 +, no rubs, no gallops Respiratory: CTA bilaterally, no wheezing, no rhonchi Abdominal: Soft, NT, ND, bowel sounds + Extremities: no edema, no cyanosis    The results of significant diagnostics from this hospitalization (including imaging, microbiology, ancillary and laboratory) are listed below for reference.     Microbiology: Recent Results (from the past 240 hour(s))  Urine Culture     Status: Abnormal   Collection Time: 12/16/18  2:26 AM  Result Value Ref Range Status   Specimen Description   Final    URINE, RANDOM Performed at [facility], [address] [place], [place] [address]    Special Requests   Final    NONE Performed at [facility], [address] [place], [place] [address]    Culture >=100,000 COLONIES/mL ESCHERICHIA COLI (A)  Final   Report Status 12/18/2018 FINAL  Final   Organism ID, Bacteria ESCHERICHIA COLI (A)  Final      Susceptibility   Escherichia coli - MIC*    AMPICILLIN >=32 RESISTANT Resistant     CEFAZOLIN <=4 SENSITIVE Sensitive     CEFTRIAXONE <=1 SENSITIVE Sensitive     CIPROFLOXACIN <=0.25 SENSITIVE Sensitive     GENTAMICIN <=1 SENSITIVE Sensitive     IMIPENEM <=0.25  SENSITIVE Sensitive     NITROFURANTOIN <=16 SENSITIVE Sensitive     TRIMETH/SULFA <=20 SENSITIVE Sensitive     AMPICILLIN/SULBACTAM >=32 RESISTANT Resistant     PIP/TAZO <=4 SENSITIVE Sensitive     Extended ESBL NEGATIVE Sensitive     * >=100,000 COLONIES/mL ESCHERICHIA COLI  Blood culture (routine x 2)     Status: Abnormal   Collection Time: 12/16/18  3:10 AM  Result Value Ref Range  Status   Specimen Description   Final    BLOOD LEFT ARM Performed at [facility], [address] [place], [place] [address]    Special Requests   Final    BOTTLES DRAWN AEROBIC AND ANAEROBIC Blood Culture adequate volume Performed at [facility], [address] [place], [place] [address]    Culture  Setup Time   Final    GRAM NEGATIVE RODS AEROBIC BOTTLE ONLY CRITICAL RESULT CALLED TO, READ BACK BY AND VERIFIED WITH: T. GREEN,PHARMD 12/17/2018 Girtha Hake Performed at [facility], [address] [place], [place] [address]    Culture ESCHERICHIA COLI (A)  Final   Report Status 12/19/2018 FINAL  Final   Organism ID, Bacteria ESCHERICHIA COLI  Final      Susceptibility   Escherichia coli - MIC*    AMPICILLIN >=32 RESISTANT Resistant     CEFAZOLIN <=4 SENSITIVE Sensitive     CEFEPIME <=1 SENSITIVE Sensitive     CEFTAZIDIME <=1 SENSITIVE Sensitive     CEFTRIAXONE <=1 SENSITIVE Sensitive     CIPROFLOXACIN <=0.25 SENSITIVE Sensitive     GENTAMICIN <=1 SENSITIVE Sensitive     IMIPENEM <=0.25 SENSITIVE Sensitive     TRIMETH/SULFA <=20 SENSITIVE Sensitive     AMPICILLIN/SULBACTAM >=32 RESISTANT Resistant     PIP/TAZO <=4 SENSITIVE Sensitive     Extended ESBL NEGATIVE Sensitive     * ESCHERICHIA COLI  Blood Culture ID Panel (Reflexed)     Status: Abnormal   Collection Time: 12/16/18  3:10 AM  Result Value Ref Range Status   Enterococcus species NOT DETECTED NOT DETECTED Final   Listeria monocytogenes NOT DETECTED NOT DETECTED Final   Staphylococcus species NOT DETECTED NOT DETECTED Final   Staphylococcus aureus (BCID) NOT DETECTED NOT DETECTED Final   Streptococcus species NOT DETECTED NOT DETECTED Final   Streptococcus agalactiae NOT DETECTED NOT DETECTED Final   Streptococcus pneumoniae NOT DETECTED NOT DETECTED Final   Streptococcus pyogenes NOT DETECTED NOT DETECTED Final   Acinetobacter baumannii NOT DETECTED NOT DETECTED Final    Enterobacteriaceae species DETECTED (A) NOT DETECTED Final    Comment: Enterobacteriaceae represent a large family of gram-negative bacteria, not a single organism. CRITICAL RESULT CALLED TO, READ BACK BY AND VERIFIED WITH: T. GREEN,PHARMD [id number] 12/17/2018 T. TYSOR    Enterobacter cloacae complex NOT DETECTED NOT DETECTED Final   Escherichia coli DETECTED (A) NOT DETECTED Final    Comment: CRITICAL RESULT CALLED TO, READ BACK BY AND VERIFIED WITH: T. GREEN,PHARMD [id number] 12/17/2018 T. TYSOR    Klebsiella oxytoca NOT DETECTED NOT DETECTED Final   Klebsiella pneumoniae NOT DETECTED NOT DETECTED Final   Proteus species NOT DETECTED NOT DETECTED Final   Serratia marcescens NOT DETECTED NOT DETECTED Final   Carbapenem resistance NOT DETECTED NOT DETECTED Final   Haemophilus influenzae NOT DETECTED NOT DETECTED Final   Neisseria meningitidis NOT DETECTED NOT DETECTED Final   Pseudomonas aeruginosa NOT DETECTED NOT DETECTED Final   Candida albicans NOT DETECTED NOT DETECTED Final  Candida glabrata NOT DETECTED NOT DETECTED Final   Candida krusei NOT DETECTED NOT DETECTED Final   Candida parapsilosis NOT DETECTED NOT DETECTED Final   Candida tropicalis NOT DETECTED NOT DETECTED Final    Comment: Performed at [facility], [address] [place], [place] [address]  Blood culture (routine x 2)     Status: None   Collection Time: 12/16/18  3:24 AM  Result Value Ref Range Status   Specimen Description   Final    BLOOD RIGHT ARM Performed at [facility], [address] [place], [place] [address]    Special Requests   Final    BOTTLES DRAWN AEROBIC AND ANAEROBIC Blood Culture adequate volume Performed at [facility], [address] [place], [place] [address]    Culture   Final    NO GROWTH 5 DAYS Performed at [facility], [address] [place], [place] [address]    Report Status 12/21/2018 FINAL  Final  Fungus culture, blood     Status: None  (Preliminary result)   Collection Time: 12/16/18  3:41 PM  Result Value Ref Range Status   Specimen Description   Final    BLOOD RIGHT HAND Performed at [facility], [address] [place], [place] [address]    Special Requests   Final    BOTTLES DRAWN AEROBIC AND ANAEROBIC Blood Culture adequate volume Performed at [facility], [address] [place], [place] [address]    Culture   Final    NO GROWTH 5 DAYS Performed at [facility], [address], [place], [place] [address]    Report Status PENDING  Incomplete  MRSA PCR Screening     Status: None   Collection Time: 12/16/18  3:52 PM  Result Value Ref Range Status   MRSA by PCR NEGATIVE NEGATIVE Final    Comment:        The GeneXpert MRSA Assay (FDA approved for NASAL specimens only), is one component of a comprehensive MRSA colonization surveillance program. It is not intended to diagnose MRSA infection nor to guide or monitor treatment for MRSA infections. Performed at [facility], [address], [place], [place] [address]   Culture, blood (routine x 2)     Status: None (Preliminary result)   Collection Time: 12/19/18  4:47 AM  Result Value Ref Range Status   Specimen Description   Final    BLOOD LEFT ARM Performed at [facility], [address] [place], [place] [address]    Special Requests   Final    BOTTLES DRAWN AEROBIC AND ANAEROBIC Blood Culture adequate volume Performed at [facility], [address] [place], [place] [address]    Culture   Final    NO GROWTH 2 DAYS Performed at [facility], [address] [place], [place] [address]    Report Status PENDING  Incomplete  Culture, blood (routine x 2)     Status: None (Preliminary result)   Collection Time: 12/19/18  4:47 AM  Result Value Ref Range Status   Specimen Description   Final    BLOOD RIGHT HAND Performed at [facility], [address] [place], [place] [address]    Special Requests   Final    BOTTLES DRAWN AEROBIC ONLY Blood Culture adequate volume Performed at [facility], [address] [place], [place] [address]  Culture   Final    NO GROWTH 2 DAYS Performed at [facility], [address] [place], [place] [address]    Report Status PENDING  Incomplete     Labs: BNP (last 3 results) No results for input(s): BNP in the last 8760 hours. Basic Metabolic Panel: Recent Labs  Lab 12/17/18 0421 12/18/18 0634 12/19/18 0447 12/20/18 0419 12/21/18 0330  NA 137 139 139 139 140  K 3.2* 3.3* 3.5 4.0 3.5  CL 111 109 106 102 102  CO2 21* GLUCOSE 116* 95 91 104* 122*  BUN 8 <5* 5* 11 11  CREATININE 0.53 0.59 0.50 0.49 0.61  CALCIUM 7.5* 8.1* 8.2* 9.1 9.3  MG  --  1.6*  --  1.6*  --    Liver Function Tests: Recent Labs  Lab 12/16/18 0226 12/17/18 0421  AST 21 14*  ALT 13 12  ALKPHOS 45 38  BILITOT 1.2 0.7  PROT 7.6 5.4*  ALBUMIN 3.8 2.6*   No results for input(s): LIPASE, AMYLASE in the last 168 hours. No results for input(s): AMMONIA in the last 168 hours. CBC: Recent Labs  Lab 12/16/18 1541 12/17/18 0421 12/18/18 0634 12/20/18 0419 12/21/18 0330  WBC 18.2* 12.8* 8.1 5.9 7.1  NEUTROABS  --   --   --  3.5  --   HGB 10.9* 9.8* 9.9* 10.9* 11.2*  HCT 33.5* 30.4* 31.4* 32.7* 34.4*  MCV 94.4 94.4 96.0 89.3 90.5  PLT 145* 127* 154 250 297   Cardiac Enzymes: No results for input(s): CKTOTAL, CKMB, CKMBINDEX, TROPONINI in the last 168 hours. BNP: Invalid input(s): POCBNP CBG: No results for input(s): GLUCAP in the last 168 hours. D-Dimer No results for input(s): DDIMER in the last 72 hours. Hgb A1c No results for input(s): HGBA1C in the last 72 hours. Lipid Profile No results for input(s): CHOL, HDL, LDLCALC, TRIG, CHOLHDL, LDLDIRECT in the last 72 hours. Thyroid function studies No results for input(s): TSH, T4TOTAL, T3FREE,  THYROIDAB in the last 72 hours.  Invalid input(s): FREET3 Anemia work up No results for input(s): VITAMINB12, FOLATE, FERRITIN, TIBC, IRON, RETICCTPCT in the last 72 hours. Urinalysis    Component Value Date/Time   COLORURINE YELLOW 12/16/2018 0226   APPEARANCEUR CLOUDY (A) 12/16/2018 0226   LABSPEC 1.025 12/16/2018 0226   PHURINE 6.0 12/16/2018 0226   GLUCOSEU NEGATIVE 12/16/2018 0226   HGBUR LARGE (A) 12/16/2018 0226   BILIRUBINUR SMALL (A) 12/16/2018 0226   KETONESUR NEGATIVE 12/16/2018 0226   PROTEINUR 100 (A) 12/16/2018 0226   UROBILINOGEN 0.2 09/10/2013 1434   NITRITE NEGATIVE 12/16/2018 0226   LEUKOCYTESUR MODERATE (A) 12/16/2018 0226   Sepsis Labs Invalid input(s): PROCALCITONIN,  WBC,  LACTICIDVEN Microbiology Recent Results (from the past 240 hour(s))  Urine Culture     Status: Abnormal   Collection Time: 12/16/18  2:26 AM  Result Value Ref Range Status   Specimen Description   Final    URINE, RANDOM Performed at [facility], [address] [place], [place] [address]    Special Requests   Final    NONE Performed at [facility], [address] [place], [place] [address]    Culture >=100,000 COLONIES/mL ESCHERICHIA COLI (A)  Final   Report Status 12/18/2018 FINAL  Final   Organism ID, Bacteria ESCHERICHIA COLI (A)  Final      Susceptibility   Escherichia coli - MIC*    AMPICILLIN >=32 RESISTANT Resistant  CEFAZOLIN <=4 SENSITIVE Sensitive     CEFTRIAXONE <=1 SENSITIVE Sensitive     CIPROFLOXACIN <=0.25 SENSITIVE Sensitive     GENTAMICIN <=1 SENSITIVE Sensitive     IMIPENEM <=0.25 SENSITIVE Sensitive     NITROFURANTOIN <=16 SENSITIVE Sensitive     TRIMETH/SULFA <=20 SENSITIVE Sensitive     AMPICILLIN/SULBACTAM >=32 RESISTANT Resistant     PIP/TAZO <=4 SENSITIVE Sensitive     Extended ESBL NEGATIVE Sensitive     * >=100,000 COLONIES/mL ESCHERICHIA COLI  Blood culture (routine x 2)     Status: Abnormal   Collection Time: 12/16/18   3:10 AM  Result Value Ref Range Status   Specimen Description   Final    BLOOD LEFT ARM Performed at [facility], [address] [place], [place] [address]    Special Requests   Final    BOTTLES DRAWN AEROBIC AND ANAEROBIC Blood Culture adequate volume Performed at [facility], [address] [place], [place] [address]    Culture  Setup Time   Final    GRAM NEGATIVE RODS AEROBIC BOTTLE ONLY CRITICAL RESULT CALLED TO, READ BACK BY AND VERIFIED WITH: T. GREEN,PHARMD 12/17/2018 Girtha Hake Performed at [facility], [address], [place], [place] [address]    Culture ESCHERICHIA COLI (A)  Final   Report Status 12/19/2018 FINAL  Final   Organism ID, Bacteria ESCHERICHIA COLI  Final      Susceptibility   Escherichia coli - MIC*    AMPICILLIN >=32 RESISTANT Resistant     CEFAZOLIN <=4 SENSITIVE Sensitive     CEFEPIME <=1 SENSITIVE Sensitive     CEFTAZIDIME <=1 SENSITIVE Sensitive     CEFTRIAXONE <=1 SENSITIVE Sensitive     CIPROFLOXACIN <=0.25 SENSITIVE Sensitive     GENTAMICIN <=1 SENSITIVE Sensitive     IMIPENEM <=0.25 SENSITIVE Sensitive     TRIMETH/SULFA <=20 SENSITIVE Sensitive     AMPICILLIN/SULBACTAM >=32 RESISTANT Resistant     PIP/TAZO <=4 SENSITIVE Sensitive     Extended ESBL NEGATIVE Sensitive     * ESCHERICHIA COLI  Blood Culture ID Panel (Reflexed)     Status: Abnormal   Collection Time: 12/16/18  3:10 AM  Result Value Ref Range Status   Enterococcus species NOT DETECTED NOT DETECTED Final   Listeria monocytogenes NOT DETECTED NOT DETECTED Final   Staphylococcus species NOT DETECTED NOT DETECTED Final   Staphylococcus aureus (BCID) NOT DETECTED NOT DETECTED Final   Streptococcus species NOT DETECTED NOT DETECTED Final   Streptococcus agalactiae NOT DETECTED NOT DETECTED Final   Streptococcus pneumoniae NOT DETECTED NOT DETECTED Final   Streptococcus pyogenes NOT DETECTED NOT DETECTED Final   Acinetobacter baumannii NOT DETECTED NOT  DETECTED Final   Enterobacteriaceae species DETECTED (A) NOT DETECTED Final    Comment: Enterobacteriaceae represent a large family of gram-negative bacteria, not a single organism. CRITICAL RESULT CALLED TO, READ BACK BY AND VERIFIED WITH: T. GREEN,PHARMD [id number] 12/17/2018 T. TYSOR    Enterobacter cloacae complex NOT DETECTED NOT DETECTED Final   Escherichia coli DETECTED (A) NOT DETECTED Final    Comment: CRITICAL RESULT CALLED TO, READ BACK BY AND VERIFIED WITH: T. GREEN,PHARMD [id number] 12/17/2018 T. TYSOR    Klebsiella oxytoca NOT DETECTED NOT DETECTED Final   Klebsiella pneumoniae NOT DETECTED NOT DETECTED Final   Proteus species NOT DETECTED NOT DETECTED Final   Serratia marcescens NOT DETECTED NOT DETECTED Final   Carbapenem resistance NOT DETECTED NOT DETECTED Final   Haemophilus influenzae  NOT DETECTED NOT DETECTED Final   Neisseria meningitidis NOT DETECTED NOT DETECTED Final   Pseudomonas aeruginosa NOT DETECTED NOT DETECTED Final   Candida albicans NOT DETECTED NOT DETECTED Final   Candida glabrata NOT DETECTED NOT DETECTED Final   Candida krusei NOT DETECTED NOT DETECTED Final   Candida parapsilosis NOT DETECTED NOT DETECTED Final   Candida tropicalis NOT DETECTED NOT DETECTED Final    Comment: Performed at [facility], [address], [place], [place] [address]  Blood culture (routine x 2)     Status: None   Collection Time: 12/16/18  3:24 AM  Result Value Ref Range Status   Specimen Description   Final    BLOOD RIGHT ARM Performed at [facility], [address] [place], [place] [address]    Special Requests   Final    BOTTLES DRAWN AEROBIC AND ANAEROBIC Blood Culture adequate volume Performed at [facility], [address] [place], [place] [address]    Culture   Final    NO GROWTH 5 DAYS Performed at [facility], [address] [place], [place] [address]    Report Status 12/21/2018 FINAL  Final  Fungus culture, blood      Status: None (Preliminary result)   Collection Time: 12/16/18  3:41 PM  Result Value Ref Range Status   Specimen Description   Final    BLOOD RIGHT HAND Performed at [facility], [address], [place], [place] [address]    Special Requests   Final    BOTTLES DRAWN AEROBIC AND ANAEROBIC Blood Culture adequate volume Performed at [facility], [address] [place], [place] [address]    Culture   Final    NO GROWTH 5 DAYS Performed at [facility], [address], [place], [place] [address]    Report Status PENDING  Incomplete  MRSA PCR Screening     Status: None   Collection Time: 12/16/18  3:52 PM  Result Value Ref Range Status   MRSA by PCR NEGATIVE NEGATIVE Final    Comment:        The GeneXpert MRSA Assay (FDA approved for NASAL specimens only), is one component of a comprehensive MRSA colonization surveillance program. It is not intended to diagnose MRSA infection nor to guide or monitor treatment for MRSA infections. Performed at [facility], [address], [place], [place] [address]   Culture, blood (routine x 2)     Status: None (Preliminary result)   Collection Time: 12/19/18  4:47 AM  Result Value Ref Range Status   Specimen Description   Final    BLOOD LEFT ARM Performed at [facility], [address], [place], [place] [address]    Special Requests   Final    BOTTLES DRAWN AEROBIC AND ANAEROBIC Blood Culture adequate volume Performed at [facility], [address] [place], [place] [address]    Culture   Final    NO GROWTH 2 DAYS Performed at [facility], [address] [place], [place] [address]    Report Status PENDING  Incomplete  Culture, blood (routine x 2)     Status: None (Preliminary result)   Collection Time: 12/19/18  4:47 AM  Result Value Ref Range Status   Specimen Description   Final    BLOOD RIGHT HAND Performed at [facility], [address] [place] [address], [place], [place] [address]  Special Requests   Final    BOTTLES DRAWN AEROBIC ONLY Blood Culture adequate volume Performed at [facility], [address] [place], [place] [address]    Culture   Final    NO GROWTH 2 DAYS Performed at [facility], [address] [place], [place] [address]    Report Status PENDING  Incomplete     Time coordinating discharge: 33 minutes  SIGNED:   Alwyn Ren, MD  [facility] 12/21/2018, 3:09 PM Pager   If 7PM-7AM, please contact night-coverage [email or web address] Password [email or web address]

## 2018-12-21 NOTE — Progress Notes (Signed)
INFECTIOUS DISEASE PROGRESS NOTE  ID: Hannah Bailey is a 33 y.o. female with  Active Problems:   Pyelonephritis of left kidney   Bronchiolitis   Splenic abscess   IVDU (intravenous drug user)   Thyroid nodule  Subjective: No complaints.   Abtx:  Anti-infectives (From admission, onward)   Start     Dose/Rate Route Frequency Ordered Stop   12/20/18 0600  ceFAZolin (ANCEF) IVPB 2g/100 mL premix     2 g 200 mL/hr over 30 Minutes Intravenous Every 8 hours 12/19/18 1620     12/17/18 1400  piperacillin-tazobactam (ZOSYN) IVPB 3.375 g  Status:  Discontinued     3.375 g 12.5 mL/hr over 240 Minutes Intravenous Every 8 hours 12/17/18 0548 12/17/18 0716   12/17/18 1300  metroNIDAZOLE (FLAGYL) IVPB 500 mg  Status:  Discontinued     500 mg 100 mL/hr over 60 Minutes Intravenous Every 8 hours 12/17/18 1052 12/17/18 1253   12/17/18 1200  cefTRIAXone (ROCEPHIN) 2 g in sodium chloride 0.9 % 100 mL IVPB  Status:  Discontinued     2 g 200 mL/hr over 30 Minutes Intravenous Every 24 hours 12/17/18 0716 12/19/18 1620   12/17/18 0545  piperacillin-tazobactam (ZOSYN) IVPB 3.375 g     3.375 g 100 mL/hr over 30 Minutes Intravenous  Once 12/17/18 0541 12/17/18 0710   12/16/18 0430  vancomycin (VANCOCIN) IVPB 1000 mg/200 mL premix     1,000 mg 200 mL/hr over 60 Minutes Intravenous  Once 12/16/18 0429 12/16/18 0539   12/16/18 0300  cefTRIAXone (ROCEPHIN) 1 g in sodium chloride 0.9 % 100 mL IVPB     1 g 200 mL/hr over 30 Minutes Intravenous  Once 12/16/18 0259 12/16/18 0401      Medications:  Scheduled:  enoxaparin (LOVENOX) injection  40 mg Subcutaneous Q24H   methocarbamol  500 mg Oral BID   nicotine  14 mg Transdermal Daily   senna-docusate  2 tablet Oral BID   sodium chloride (PF)        Objective: Vital signs in last 24 hours: Temp:  [97.8 F (36.6 C)-98.6 F (37 C)] 98.6 F (37 C) (03/12 1307) Pulse Rate:  [72-86] 86 (03/12 1307) Resp:  [16-20] 16 (03/12 1307) BP:  (99-116)/(57-80) 116/57 (03/12 1307) SpO2:  [98 %-99 %] 99 % (03/12 1307)   General appearance: alert, cooperative and no distress Resp: clear to auscultation bilaterally Cardio: regular rate and rhythm GI: normal findings: bowel sounds normal and soft, non-tender  Lab Results Recent Labs    12/20/18 0419 12/21/18 0330  WBC 5.9 7.1  HGB 10.9* 11.2*  HCT 32.7* 34.4*  NA 139 140  K 4.0 3.5  CL 102 102  CO2 26 30  BUN 11 11  CREATININE 0.49 0.61   Liver Panel No results for input(s): PROT, ALBUMIN, AST, ALT, ALKPHOS, BILITOT, BILIDIR, IBILI in the last 72 hours. Sedimentation Rate No results for input(s): ESRSEDRATE in the last 72 hours. C-Reactive Protein No results for input(s): CRP in the last 72 hours.  Microbiology: Recent Results (from the past 240 hour(s))  Urine Culture     Status: Abnormal   Collection Time: 12/16/18  2:26 AM  Result Value Ref Range Status   Specimen Description   Final    URINE, RANDOM Performed at [facility], [address] [place], [place] [address]    Special Requests   Final    NONE Performed at [facility], [address] [place], [place] [address]    Culture >=100,000 COLONIES/mL ESCHERICHIA COLI (A)  Final   Report Status 12/18/2018 FINAL  Final   Organism ID, Bacteria ESCHERICHIA COLI (A)  Final      Susceptibility   Escherichia coli - MIC*    AMPICILLIN >=32 RESISTANT Resistant     CEFAZOLIN <=4 SENSITIVE Sensitive     CEFTRIAXONE <=1 SENSITIVE Sensitive     CIPROFLOXACIN <=0.25 SENSITIVE Sensitive     GENTAMICIN <=1 SENSITIVE Sensitive     IMIPENEM <=0.25 SENSITIVE Sensitive     NITROFURANTOIN <=16 SENSITIVE Sensitive     TRIMETH/SULFA <=20 SENSITIVE Sensitive     AMPICILLIN/SULBACTAM >=32 RESISTANT Resistant     PIP/TAZO <=4 SENSITIVE Sensitive     Extended ESBL NEGATIVE Sensitive     * >=100,000 COLONIES/mL ESCHERICHIA COLI  Blood culture (routine x 2)     Status: Abnormal   Collection Time:  12/16/18  3:10 AM  Result Value Ref Range Status   Specimen Description   Final    BLOOD LEFT ARM Performed at [facility], [address] [place], [place] [address]    Special Requests   Final    BOTTLES DRAWN AEROBIC AND ANAEROBIC Blood Culture adequate volume Performed at [facility], [address] [place], [place] [address]    Culture  Setup Time   Final    GRAM NEGATIVE RODS AEROBIC BOTTLE ONLY CRITICAL RESULT CALLED TO, READ BACK BY AND VERIFIED WITH: T. GREEN,PHARMD 12/17/2018 Girtha Hake Performed at [facility], [address] [place], [place] [address]    Culture ESCHERICHIA COLI (A)  Final   Report Status 12/19/2018 FINAL  Final   Organism ID, Bacteria ESCHERICHIA COLI  Final      Susceptibility   Escherichia coli - MIC*    AMPICILLIN >=32 RESISTANT Resistant     CEFAZOLIN <=4 SENSITIVE Sensitive     CEFEPIME <=1 SENSITIVE Sensitive     CEFTAZIDIME <=1 SENSITIVE Sensitive     CEFTRIAXONE <=1 SENSITIVE Sensitive     CIPROFLOXACIN <=0.25 SENSITIVE Sensitive     GENTAMICIN <=1 SENSITIVE Sensitive     IMIPENEM <=0.25 SENSITIVE Sensitive     TRIMETH/SULFA <=20 SENSITIVE Sensitive     AMPICILLIN/SULBACTAM >=32 RESISTANT Resistant     PIP/TAZO <=4 SENSITIVE Sensitive     Extended ESBL NEGATIVE Sensitive     * ESCHERICHIA COLI  Blood Culture ID Panel (Reflexed)     Status: Abnormal   Collection Time: 12/16/18  3:10 AM  Result Value Ref Range Status   Enterococcus species NOT DETECTED NOT DETECTED Final   Listeria monocytogenes NOT DETECTED NOT DETECTED Final   Staphylococcus species NOT DETECTED NOT DETECTED Final   Staphylococcus aureus (BCID) NOT DETECTED NOT DETECTED Final   Streptococcus species NOT DETECTED NOT DETECTED Final   Streptococcus agalactiae NOT DETECTED NOT DETECTED Final   Streptococcus pneumoniae NOT DETECTED NOT DETECTED Final   Streptococcus pyogenes NOT DETECTED NOT DETECTED Final   Acinetobacter baumannii NOT  DETECTED NOT DETECTED Final   Enterobacteriaceae species DETECTED (A) NOT DETECTED Final    Comment: Enterobacteriaceae represent a large family of gram-negative bacteria, not a single organism. CRITICAL RESULT CALLED TO, READ BACK BY AND VERIFIED WITH: T. GREEN,PHARMD 1700 12/17/2018 T. TYSOR    Enterobacter cloacae complex NOT DETECTED NOT DETECTED Final   Escherichia coli DETECTED (A) NOT DETECTED Final    Comment: CRITICAL RESULT CALLED TO, READ BACK BY AND VERIFIED WITH: T.  GREEN,PHARMD 1610 12/17/2018 T. TYSOR    Klebsiella oxytoca NOT DETECTED NOT DETECTED Final   Klebsiella pneumoniae NOT DETECTED NOT DETECTED Final   Proteus species NOT DETECTED NOT DETECTED Final   Serratia marcescens NOT DETECTED NOT DETECTED Final   Carbapenem resistance NOT DETECTED NOT DETECTED Final   Haemophilus influenzae NOT DETECTED NOT DETECTED Final   Neisseria meningitidis NOT DETECTED NOT DETECTED Final   Pseudomonas aeruginosa NOT DETECTED NOT DETECTED Final   Candida albicans NOT DETECTED NOT DETECTED Final   Candida glabrata NOT DETECTED NOT DETECTED Final   Candida krusei NOT DETECTED NOT DETECTED Final   Candida parapsilosis NOT DETECTED NOT DETECTED Final   Candida tropicalis NOT DETECTED NOT DETECTED Final    Comment: Performed at [facility], [address] [place], [place] [address]  Blood culture (routine x 2)     Status: None   Collection Time: 12/16/18  3:24 AM  Result Value Ref Range Status   Specimen Description   Final    BLOOD RIGHT ARM Performed at [facility], [address] [place], [place] [address]    Special Requests   Final    BOTTLES DRAWN AEROBIC AND ANAEROBIC Blood Culture adequate volume Performed at [facility], [address] [place], [place] [address]    Culture   Final    NO GROWTH 5 DAYS Performed at [facility], [address] [place], [place] [address]    Report Status 12/21/2018 FINAL  Final  Fungus culture,  blood     Status: None (Preliminary result)   Collection Time: 12/16/18  3:41 PM  Result Value Ref Range Status   Specimen Description   Final    BLOOD RIGHT HAND Performed at [facility], [address], [place], [place] [address]    Special Requests   Final    BOTTLES DRAWN AEROBIC AND ANAEROBIC Blood Culture adequate volume Performed at [facility], [address], [place], [place] [address]    Culture   Final    NO GROWTH 5 DAYS Performed at [facility], [address], [place], [place] [address]    Report Status PENDING  Incomplete  MRSA PCR Screening     Status: None   Collection Time: 12/16/18  3:52 PM  Result Value Ref Range Status   MRSA by PCR NEGATIVE NEGATIVE Final    Comment:        The GeneXpert MRSA Assay (FDA approved for NASAL specimens only), is one component of a comprehensive MRSA colonization surveillance program. It is not intended to diagnose MRSA infection nor to guide or monitor treatment for MRSA infections. Performed at [facility], [address], [place], [place] [address]   Culture, blood (routine x 2)     Status: None (Preliminary result)   Collection Time: 12/19/18  4:47 AM  Result Value Ref Range Status   Specimen Description   Final    BLOOD LEFT ARM Performed at [facility], [address] [place], [place] [address]    Special Requests   Final    BOTTLES DRAWN AEROBIC AND ANAEROBIC Blood Culture adequate volume Performed at [facility], [address], [place], [place] [address]    Culture   Final    NO GROWTH 2 DAYS Performed at [facility], [address] [place], [place] [address]    Report Status PENDING  Incomplete  Culture, blood (routine x 2)     Status: None (Preliminary result)   Collection Time: 12/19/18  4:47 AM  Result Value Ref Range Status   Specimen Description   Final    BLOOD RIGHT HAND Performed at  [facility], [address] [place], [place] [address]    Special Requests   Final    BOTTLES DRAWN AEROBIC ONLY Blood Culture adequate volume Performed at [facility], [address], [place], [place] [address]    Culture   Final    NO GROWTH 2 DAYS Performed at [facility], [address] [place], [place] [address]    Report Status PENDING  Incomplete    Studies/Results: Ct Abdomen Pelvis W Contrast  Result Date: 12/21/2018 CLINICAL DATA:  Abdominal infection, sepsis, splenic abscess EXAM: CT ABDOMEN AND PELVIS WITH CONTRAST TECHNIQUE: Multidetector CT imaging of the abdomen and pelvis was performed using the standard protocol following bolus administration of intravenous contrast. CONTRAST:  OMNIPAQUE IOHEXOL 300 MG/ML  SOLN COMPARISON:  12/16/2018 FINDINGS: Lower chest: No change in clustered centrilobular nodularity of the right lung base, partially imaged (series 4, image 1). Hepatobiliary: No focal liver abnormality is seen. No gallstones, gallbladder wall thickening, or biliary dilatation. Pancreas: Unremarkable. No pancreatic ductal dilatation or surrounding inflammatory changes. Spleen: Numerous hypodense subcentimeter lesions of the spleen, unchanged from prior. Adrenals/Urinary Tract: Adrenal glands are unremarkable. Redemonstrated hypodensity at the medial upper pole of the right kidney, consistent with pyelonephritis but without evidence of discrete fluid collections. Bladder is unremarkable. Stomach/Bowel: Stomach is within normal limits. Appendix appears normal. No evidence of bowel wall thickening, distention, or inflammatory changes. Large burden of stool in the colon. Vascular/Lymphatic: No significant vascular findings are present. No enlarged abdominal or pelvic lymph nodes. Reproductive: No mass or other abnormality. Other: No abdominal wall hernia or abnormality. No abdominopelvic ascites. Musculoskeletal: No acute or  significant osseous findings. IMPRESSION: 1.  Generally unchanged examination of the abdomen and pelvis. 2. Redemonstrated hypodensity at the medial upper pole of the right kidney, consistent with pyelonephritis but without evidence of discrete fluid collection to suggest abscess. 3. Numerous hypodense subcentimeter lesions of the spleen, unchanged from prior. As on prior examination, these are suspicious for infectious etiology, including hematogenous fungal infection. Electronically Signed   By: Lauralyn Primes M.D.   On: 12/21/2018 14:23     Assessment/Plan: Splenic abscesses Pyelo E coli bacteremia, UTI Nonobstructing left nephrolithiasis. Remote IVDU  Total days of antibiotics:6 (ceftriaxone --> ancef)  HIV and Hep C (-) Repeat BCx sent 3-10 and is ngtd.  Repeat imaging 3-12 is not much changed.  I would consider giving her levaquin 750mg  orally for the next 2 weeks.  Do not take with dairy, Ca, Mg, Al. Explained to pt and that these instructions will also be given to her by the pharmacy.  Can f/u with PCP.  Available as needed.          Johny Sax MD, FACP Infectious Diseases (pager) [phone] [email or web address] 12/21/2018, 2:42 PM  LOS: 5 days

## 2018-12-23 LAB — FUNGUS CULTURE, BLOOD
Culture: NO GROWTH
Special Requests: ADEQUATE

## 2018-12-24 LAB — CULTURE, BLOOD (ROUTINE X 2)
Culture: NO GROWTH
Culture: NO GROWTH
SPECIAL REQUESTS: ADEQUATE
Special Requests: ADEQUATE

## 2019-08-21 ENCOUNTER — Encounter (HOSPITAL_BASED_OUTPATIENT_CLINIC_OR_DEPARTMENT_OTHER): Payer: Self-pay

## 2019-08-21 ENCOUNTER — Other Ambulatory Visit: Payer: Self-pay

## 2019-08-21 ENCOUNTER — Emergency Department (HOSPITAL_BASED_OUTPATIENT_CLINIC_OR_DEPARTMENT_OTHER)
Admission: EM | Admit: 2019-08-21 | Discharge: 2019-08-21 | Disposition: A | Discharge status: Home / Self Care | Payer: Medicaid Other | Attending: Emergency Medicine | Admitting: Emergency Medicine

## 2019-08-21 ENCOUNTER — Emergency Department (HOSPITAL_BASED_OUTPATIENT_CLINIC_OR_DEPARTMENT_OTHER): Payer: Medicaid Other

## 2019-08-21 DIAGNOSIS — N941 Unspecified dyspareunia: Secondary | ICD-10-CM

## 2019-08-21 DIAGNOSIS — B9689 Other specified bacterial agents as the cause of diseases classified elsewhere: Secondary | ICD-10-CM | POA: Insufficient documentation

## 2019-08-21 DIAGNOSIS — R42 Dizziness and giddiness: Secondary | ICD-10-CM | POA: Diagnosis not present

## 2019-08-21 DIAGNOSIS — N760 Acute vaginitis: Secondary | ICD-10-CM | POA: Insufficient documentation

## 2019-08-21 DIAGNOSIS — R109 Unspecified abdominal pain: Secondary | ICD-10-CM | POA: Diagnosis present

## 2019-08-21 DIAGNOSIS — F17210 Nicotine dependence, cigarettes, uncomplicated: Secondary | ICD-10-CM | POA: Diagnosis not present

## 2019-08-21 DIAGNOSIS — R1084 Generalized abdominal pain: Secondary | ICD-10-CM

## 2019-08-21 HISTORY — DX: Herpesviral infection, unspecified: B00.9

## 2019-08-21 LAB — WET PREP, GENITAL
Trich, Wet Prep: NONE SEEN
Yeast Wet Prep HPF POC: NONE SEEN

## 2019-08-21 LAB — URINALYSIS, ROUTINE W REFLEX MICROSCOPIC
Bilirubin Urine: NEGATIVE
Glucose, UA: NEGATIVE mg/dL
Ketones, ur: NEGATIVE mg/dL
Leukocytes,Ua: NEGATIVE
Nitrite: NEGATIVE
Protein, ur: NEGATIVE mg/dL
Specific Gravity, Urine: 1.025 (ref 1.005–1.030)
pH: 6.5 (ref 5.0–8.0)

## 2019-08-21 LAB — CBC
HCT: 38.6 % (ref 36.0–46.0)
Hemoglobin: 12.7 g/dL (ref 12.0–15.0)
MCH: 30.8 pg (ref 26.0–34.0)
MCHC: 32.9 g/dL (ref 30.0–36.0)
MCV: 93.7 fL (ref 80.0–100.0)
Platelets: 225 10*3/uL (ref 150–400)
RBC: 4.12 MIL/uL (ref 3.87–5.11)
RDW: 11.8 % (ref 11.5–15.5)
WBC: 9.7 10*3/uL (ref 4.0–10.5)
nRBC: 0 % (ref 0.0–0.2)

## 2019-08-21 LAB — COMPREHENSIVE METABOLIC PANEL
ALT: 18 U/L (ref 0–44)
AST: 20 U/L (ref 15–41)
Albumin: 4 g/dL (ref 3.5–5.0)
Alkaline Phosphatase: 33 U/L — ABNORMAL LOW (ref 38–126)
Anion gap: 8 (ref 5–15)
BUN: 18 mg/dL (ref 6–20)
CO2: 24 mmol/L (ref 22–32)
Calcium: 9.3 mg/dL (ref 8.9–10.3)
Chloride: 105 mmol/L (ref 98–111)
Creatinine, Ser: 0.61 mg/dL (ref 0.44–1.00)
GFR calc Af Amer: >60 mL/min (ref >60)
GFR calc non Af Amer: >60 mL/min (ref >60)
Glucose, Bld: 131 mg/dL — ABNORMAL HIGH (ref 70–99)
Potassium: 3.8 mmol/L (ref 3.5–5.1)
Sodium: 137 mmol/L (ref 135–145)
Total Bilirubin: 0.4 mg/dL (ref 0.3–1.2)
Total Protein: 6.7 g/dL (ref 6.5–8.1)

## 2019-08-21 LAB — LACTIC ACID, PLASMA: Lactic Acid, Venous: 1.2 mmol/L (ref 0.5–1.9)

## 2019-08-21 LAB — LIPASE, BLOOD: Lipase: 30 U/L (ref 11–51)

## 2019-08-21 LAB — URINALYSIS, MICROSCOPIC (REFLEX): WBC, UA: NONE SEEN WBC/hpf (ref 0–5)

## 2019-08-21 LAB — PREGNANCY, URINE: Preg Test, Ur: NEGATIVE

## 2019-08-21 MED ORDER — METRONIDAZOLE 500 MG PO TABS: 500.0000 mg | ORAL_TABLET | Freq: Two times a day (BID) | ORAL | 0 refills | Status: AC

## 2019-08-21 MED ORDER — IOHEXOL 300 MG/ML  SOLN
100.0000 mL | Freq: Once | INTRAMUSCULAR | Status: AC
  Administered 2019-08-21: 100 mL via INTRAVENOUS

## 2019-08-21 MED ORDER — KETOROLAC TROMETHAMINE 15 MG/ML IJ SOLN
15.0000 mg | Freq: Once | INTRAMUSCULAR | Status: AC
  Administered 2019-08-21: 15 mg via INTRAVENOUS
  Filled 2019-08-21: qty 1

## 2019-08-21 NOTE — ED Provider Notes (Signed)
[facility] Provider Note MRN:  [id number]  Arrival date & time: 08/21/19     Chief Complaint   Abdominal Pain   History of Present Illness   Hannah Bailey is a 33 y.o. year-old female with a history of IV drug use presenting to the ED with chief complaint of abdominal pain.  3 weeks of moderate severity diffuse abdominal pain.  Pain is intermittent but occurs throughout the day.  Described as a burning pain.  Denies fever, but has been experiencing dizziness and lightheadedness, sensation of room spinning intermittently.  Denies neck pain, no numbness or weakness to the arms or legs, no chest pain or shortness of breath.  Also endorsing some vaginal discharge.  Denies dysuria.  Review of Systems  A complete 10 system review of systems was obtained and all systems are negative except as noted in the HPI and PMH.   Patient's Health History    Past Medical History:  Diagnosis Date  . Drug abuse, IV (HCC)   . Herpes   . Thyroid disease     Past Surgical History:  Procedure Laterality Date  . LASIK    . REFRACTIVE SURGERY Bilateral     No family history on file.  Social History   Socioeconomic History  . Marital status: Single    Spouse name: Not on file  . Number of children: Not on file  . Years of education: Not on file  . Highest education level: Not on file  Occupational History  . Not on file  Social Needs  . Financial resource strain: Not hard at all  . Food insecurity    Worry: Patient refused    Inability: Patient refused  . Transportation needs    Medical: Patient refused    Non-medical: Patient refused  Tobacco Use  . Smoking status: Current Every Day Smoker    Packs/day: 0.50    Types: Cigarettes  . Smokeless tobacco: Never Used  Substance and Sexual Activity  . Alcohol use: Yes    Comment: occ  . Drug use: Not Currently  . Sexual activity: Not on file  Lifestyle  . Physical activity    Days  per week: Patient refused    Minutes per session: Patient refused  . Stress: Not on file  Relationships  . Social Musicianconnections    Talks on phone: Patient refused    Gets together: Patient refused    Attends religious service: Patient refused    Active member of club or organization: Patient refused    Attends meetings of clubs or organizations: Patient refused    Relationship status: Patient refused  . Intimate partner violence    Fear of current or ex partner: Patient refused    Emotionally abused: Patient refused    Physically abused: Patient refused    Forced sexual activity: Patient refused  Other Topics Concern  . Not on file  Social History Narrative  . Not on file     Physical Exam  Vital Signs and Nursing Notes reviewed Vitals:   08/21/19 1749 08/21/19 1914  BP: 115/74 102/73  Pulse: 93 79  Resp: 16 14  Temp: 99.1 F (37.3 C) 98.6 F (37 C)  SpO2: 100% 100%    CONSTITUTIONAL: Well-appearing, NAD NEURO:  Alert and oriented x 3, no focal deficits EYES:  eyes equal and reactive ENT/NECK:  no LAD, no JVD CARDIO: Regular rate, well-perfused, normal S1 and S2 PULM:  CTAB no wheezing or rhonchi GI/GU:  normal bowel sounds, non-distended, non-tender MSK/SPINE:  No gross deformities, no edema SKIN:  no rash, atraumatic PSYCH:  Appropriate speech and behavior  Diagnostic and Interventional Summary    EKG Interpretation  Date/Time:    Ventricular Rate:    PR Interval:    QRS Duration:   QT Interval:    QTC Calculation:   R Axis:     Text Interpretation:        Labs Reviewed  WET PREP, GENITAL - Abnormal; Notable for the following components:      Result Value   Clue Cells Wet Prep HPF POC PRESENT (*)    WBC, Wet Prep HPF POC MANY (*)    All other components within normal limits  COMPREHENSIVE METABOLIC PANEL - Abnormal; Notable for the following components:   Glucose, Bld 131 (*)    Alkaline Phosphatase 33 (*)    All other components within normal  limits  URINALYSIS, ROUTINE W REFLEX MICROSCOPIC - Abnormal; Notable for the following components:   Hgb urine dipstick TRACE (*)    All other components within normal limits  URINALYSIS, MICROSCOPIC (REFLEX) - Abnormal; Notable for the following components:   Bacteria, UA RARE (*)    All other components within normal limits  CBC  LIPASE, BLOOD  LACTIC ACID, PLASMA  PREGNANCY, URINE  GC/CHLAMYDIA PROBE AMP ([place]) NOT AT [facility]    CT ABDOMEN PELVIS W CONTRAST  Final Result      Medications  ketorolac (TORADOL) 15 MG/ML injection 15 mg (15 mg Intravenous Given 08/21/19 1905)  iohexol (OMNIPAQUE) 300 MG/ML solution 100 mL (100 mLs Intravenous Contrast Given 08/21/19 1932)     Procedures  /  Critical Care Procedures  ED Course and Medical Decision Making  I have reviewed the triage vital signs and the nursing notes.  Pertinent labs & imaging results that were available during my care of the patient were reviewed by me and considered in my medical decision making (see below for details).     Patient has a history of IV drug use but states that she has been sober and without intravenous use of drugs for the past 6 years.  Still she had an admission earlier in the year with pyelonephritis complicated by multiple splenic abscesses.  This was managed medically with antibiotics.  She states that some of her symptoms are similar to that episode.  Suspect will need CT imaging, but we will first confirm that she is not pregnant.  Also obtain pelvic exam given the discharge, to evaluate for PID.  Pelvic exam is largely unremarkable, no adnexal mass or tenderness, no cervical motion tenderness, minimal mucus or discharge. Wet prep is positive for clue cells. CT scan is otherwise normal. Patient is appropriate for discharge on Flagyl and gynecology follow-up given her pain with intercourse. Considering endometriosis.  Barth Kirks. Sedonia Small, [place] [email or web address]  Final Clinical Impressions(s) / ED Diagnoses     ICD-10-CM   1. Dyspareunia, female  N94.10   2. Generalized abdominal pain  R10.84   3. Bacterial vaginosis  N76.0    B96.89     ED Discharge Orders         Ordered    metroNIDAZOLE (FLAGYL) 500 MG tablet  2 times daily     08/21/19 2016           Discharge Instructions Discussed with and Provided to Patient:     Discharge Instructions     You  were evaluated in the Emergency Department and after careful evaluation, we did not find any emergent condition requiring admission or further testing in the hospital.  Your exam/testing today is overall reassuring.  Some of your symptoms seem to be due to bacterial vaginosis.  Please take the Flagyl medication as directed.  We recommend GYN follow-up to further discuss your pain with intercourse.  Please return to the Emergency Department if you experience any worsening of your condition.  We encourage you to follow up with a primary care provider.  Thank you for allowing [place] to be a part of your care.       Sabas Sous, MD 08/21/19 2312

## 2019-08-21 NOTE — Discharge Instructions (Addendum)
You were evaluated in the Emergency Department and after careful evaluation, we did not find any emergent condition requiring admission or further testing in the hospital.  Your exam/testing today is overall reassuring.  Some of your symptoms seem to be due to bacterial vaginosis.  Please take the Flagyl medication as directed.  We recommend GYN follow-up to further discuss your pain with intercourse.  Please return to the Emergency Department if you experience any worsening of your condition.  We encourage you to follow up with a primary care provider.  Thank you for allowing [place] to be a part of your care.

## 2019-08-21 NOTE — ED Triage Notes (Addendum)
Pt c/o abd pain that "feels like a burning sensation" x 2-3 weeks with n/v/d-pt states she was seen for similar c/o in the past ~1 year ago and "they thought they were going to remove my spleen" pt did not have f/u-states pain feels the same-NAD-steady gait-pt added that pain is worse during intercourse

## 2019-08-23 LAB — GC/CHLAMYDIA PROBE AMP (~~LOC~~) NOT AT ARMC
Chlamydia: NEGATIVE
Neisseria Gonorrhea: NEGATIVE

## 2020-09-25 IMAGING — CT CT ABDOMEN AND PELVIS WITH CONTRAST
2 of 4 series · 16 of 46 positions shown, 18 images · IV contrast (APPLIED)
Comparison: 12/16/2018

CLINICAL DATA: Abdominal infection, sepsis, splenic abscess

EXAM:
CT ABDOMEN AND PELVIS WITH CONTRAST
TECHNIQUE: Multidetector CT imaging of the abdomen and pelvis was performed
using the standard protocol following bolus administration of
intravenous contrast.
CONTRAST:  100mL OMNIPAQUE IOHEXOL 300 MG/ML  SOLN

[Series 2: axial st · axial · 0.68mm/px · z∈[-398,-38]mm · 13 of 82 slices shown, 15 images]
[im 5/82  soft-tissue]
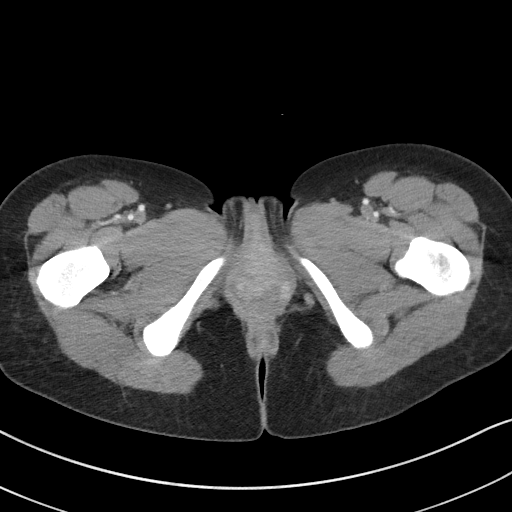
[im 5/82  bone]
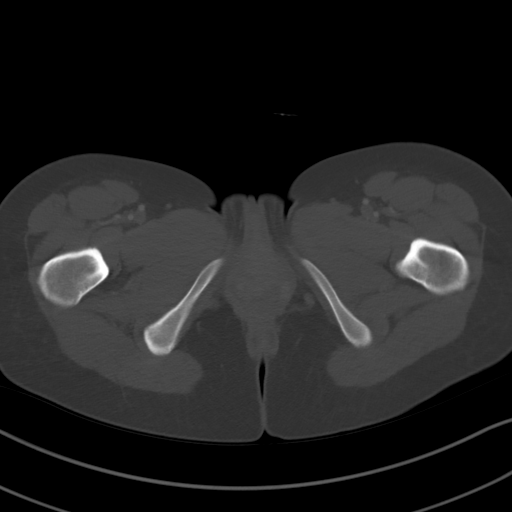
[im 13/82  soft-tissue]
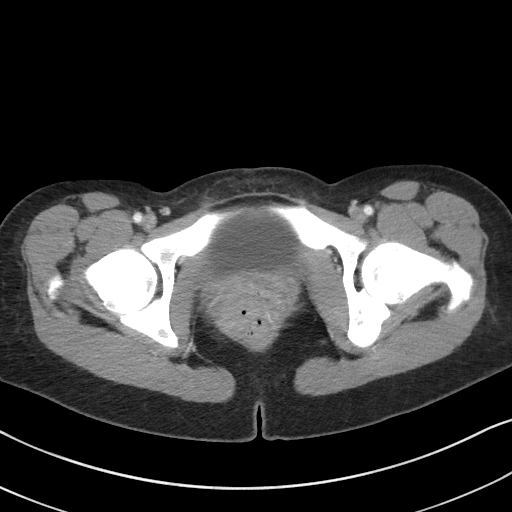
[im 17/82  soft-tissue]
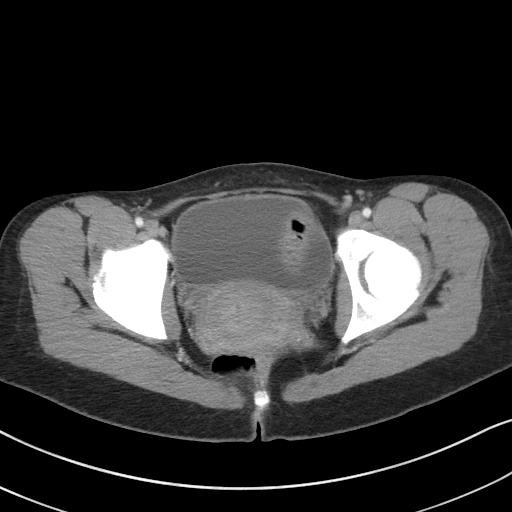
[im 25/82  soft-tissue]
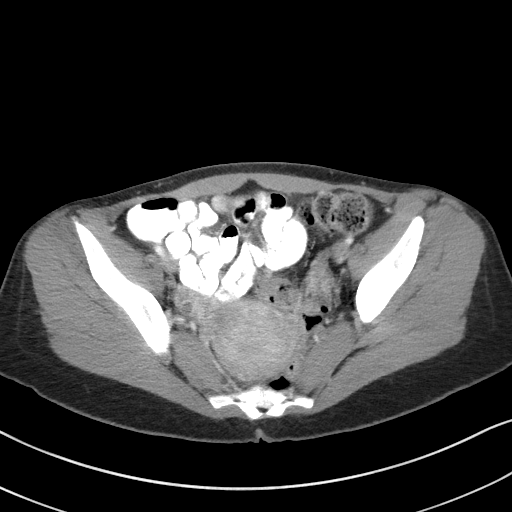
[im 29/82  soft-tissue]
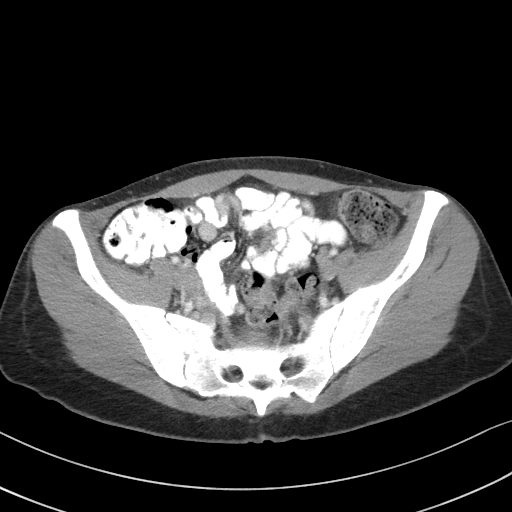
[im 37/82  soft-tissue]
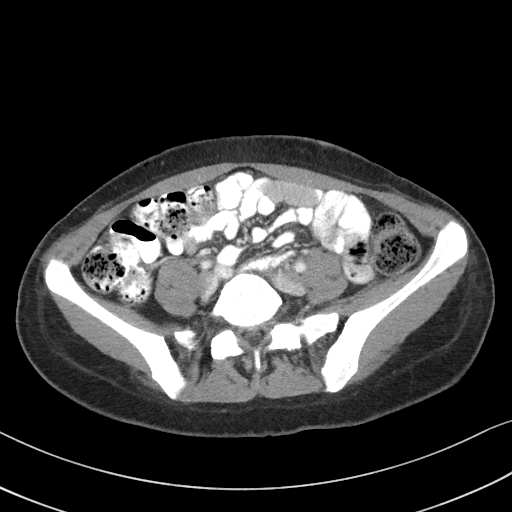
[im 41/82  soft-tissue]
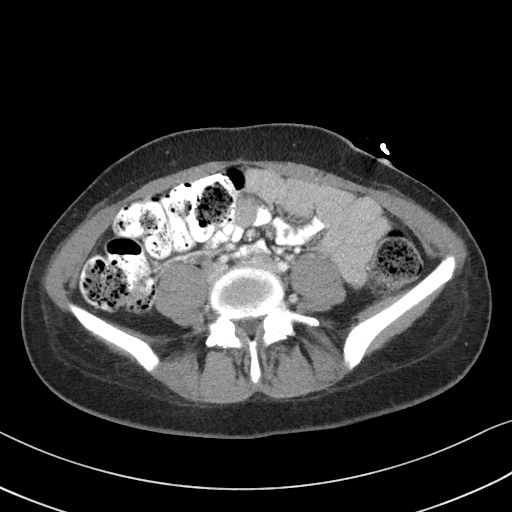
[im 45/82  soft-tissue]
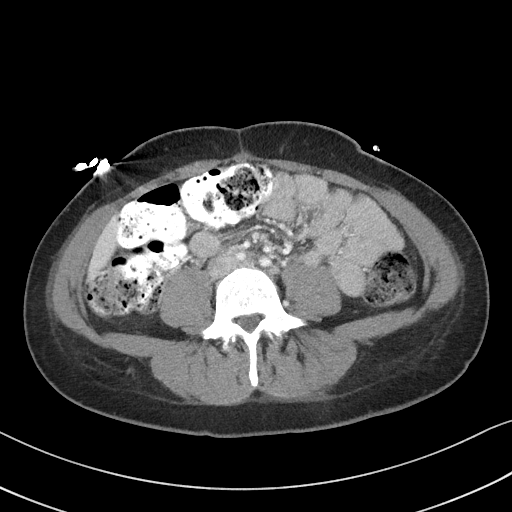
[im 53/82  soft-tissue]
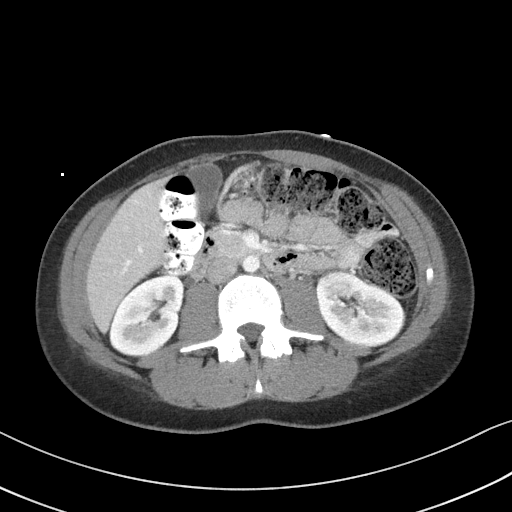
[im 53/82  bone]
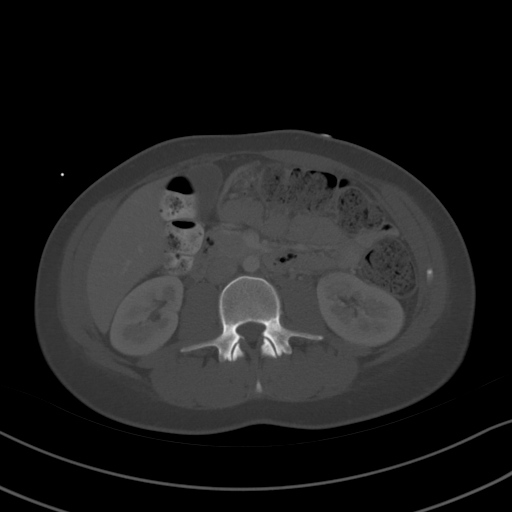
[im 57/82  soft-tissue]
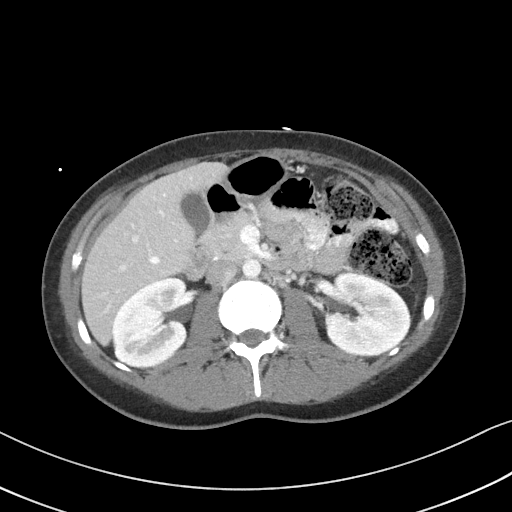
[im 65/82  soft-tissue]
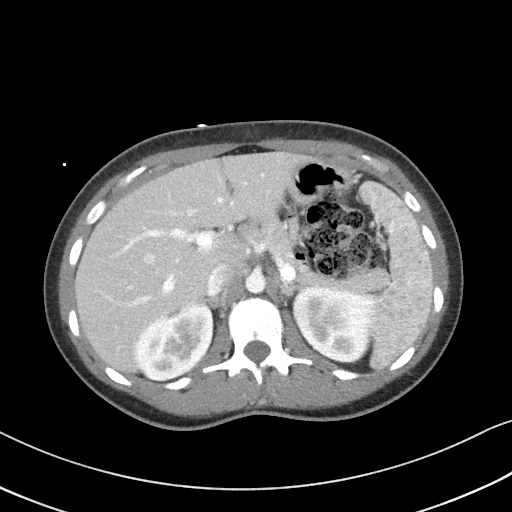
[im 69/82  soft-tissue]
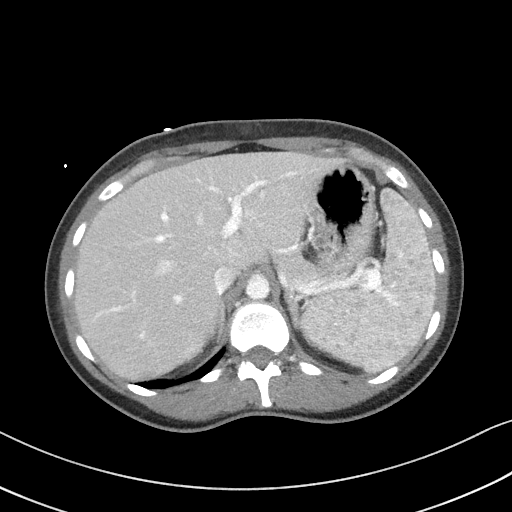
[im 77/82  soft-tissue]
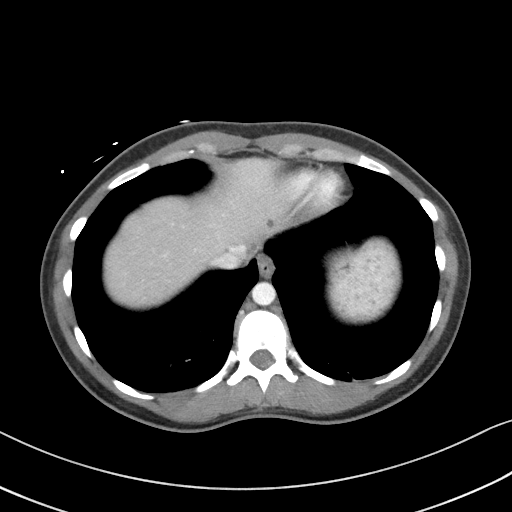

[Series 5: coronal st · coronal · 0.68mm/px · 3 of 72 slices shown]
[im 24/72  soft-tissue]
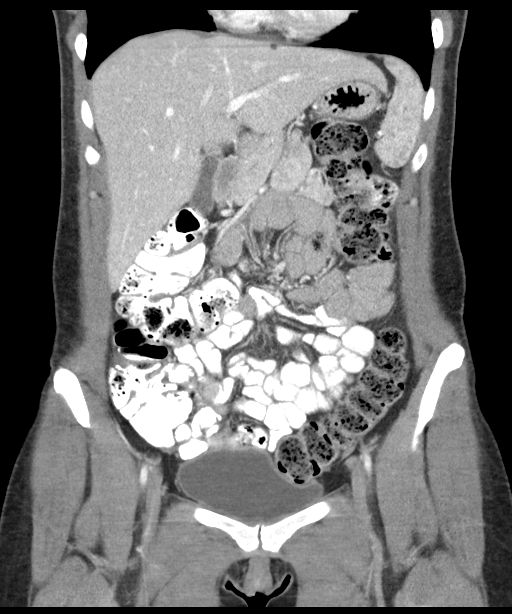
[im 32/72  soft-tissue]
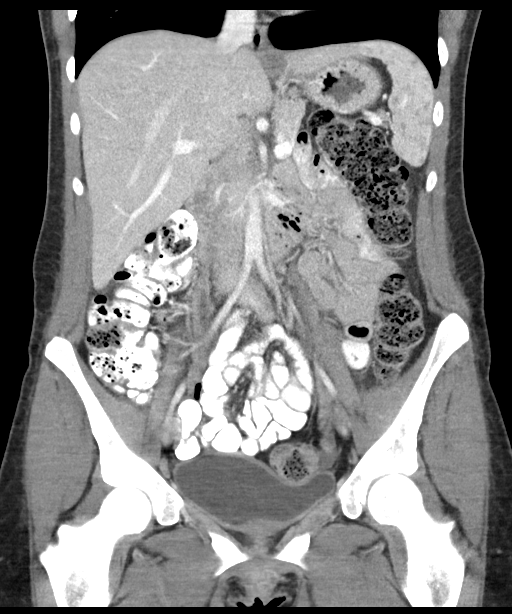
[im 40/72  soft-tissue]
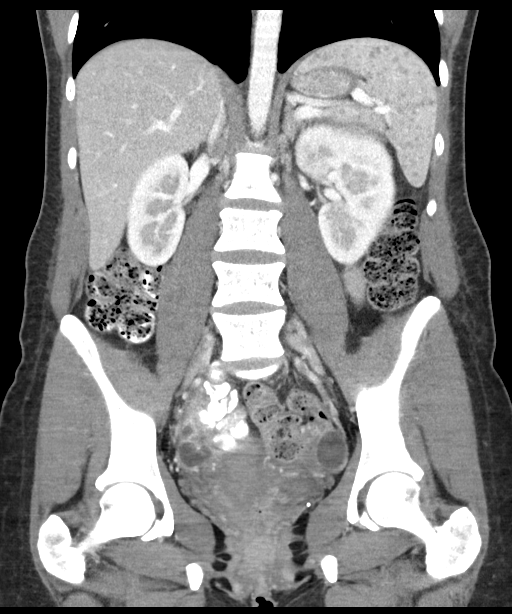

[16 of 46 positions shown; findings below may reference images not displayed]

FINDINGS: Lower chest: No change in clustered centrilobular nodularity of the
right lung base, partially imaged (series 4, image 1).

Hepatobiliary: No focal liver abnormality is seen. No gallstones,
gallbladder wall thickening, or biliary dilatation.

Pancreas: Unremarkable. No pancreatic ductal dilatation or
surrounding inflammatory changes.

Spleen: Numerous hypodense subcentimeter lesions of the spleen,
unchanged from prior.

Adrenals/Urinary Tract: Adrenal glands are unremarkable.
Redemonstrated hypodensity at the medial upper pole of the right
kidney, consistent with pyelonephritis but without evidence of
discrete fluid collections. Bladder is unremarkable.

Stomach/Bowel: Stomach is within normal limits. Appendix appears
normal. No evidence of bowel wall thickening, distention, or
inflammatory changes. Large burden of stool in the colon.

Vascular/Lymphatic: No significant vascular findings are present. No
enlarged abdominal or pelvic lymph nodes.

Reproductive: No mass or other abnormality.

Other: No abdominal wall hernia or abnormality. No abdominopelvic
ascites.

Musculoskeletal: No acute or significant osseous findings.
IMPRESSION: 1.  Generally unchanged examination of the abdomen and pelvis.

2. Redemonstrated hypodensity at the medial upper pole of the right
kidney, consistent with pyelonephritis but without evidence of
discrete fluid collection to suggest abscess.

3. Numerous hypodense subcentimeter lesions of the spleen, unchanged
from prior. As on prior examination, these are suspicious for
infectious etiology, including hematogenous fungal infection.
